# Patient Record
Sex: Female | Born: 1975 | Race: Black or African American | Hispanic: No | Marital: Single | Smoking: Never smoker
Health system: Southern US, Community
[De-identification: ages and names within clinical notes are randomized; demographics above are authoritative.]

## PROBLEM LIST (undated history)

## (undated) DIAGNOSIS — I491 Atrial premature depolarization: Secondary | ICD-10-CM

## (undated) DIAGNOSIS — J302 Other seasonal allergic rhinitis: Secondary | ICD-10-CM

## (undated) DIAGNOSIS — K219 Gastro-esophageal reflux disease without esophagitis: Secondary | ICD-10-CM

## (undated) DIAGNOSIS — R002 Palpitations: Secondary | ICD-10-CM

## (undated) DIAGNOSIS — I2510 Atherosclerotic heart disease of native coronary artery without angina pectoris: Secondary | ICD-10-CM

## (undated) HISTORY — DX: Gastro-esophageal reflux disease without esophagitis: K21.9

## (undated) HISTORY — DX: Palpitations: R00.2

## (undated) HISTORY — DX: Other seasonal allergic rhinitis: J30.2

## (undated) HISTORY — PX: NO PAST SURGERIES: SHX2092

## (undated) HISTORY — DX: Atrial premature depolarization: I49.1

## (undated) HISTORY — DX: Atherosclerotic heart disease of native coronary artery without angina pectoris: I25.10

---

## 2000-08-28 ENCOUNTER — Other Ambulatory Visit: Admission: RE | Admit: 2000-08-28 | Discharge: 2000-08-28 | Payer: Self-pay | Admitting: Family Medicine

## 2001-09-12 ENCOUNTER — Other Ambulatory Visit: Admission: RE | Admit: 2001-09-12 | Discharge: 2001-09-12 | Payer: Self-pay | Admitting: Family Medicine

## 2002-01-22 ENCOUNTER — Encounter: Payer: Self-pay | Admitting: Family Medicine

## 2002-01-22 ENCOUNTER — Encounter: Admission: RE | Admit: 2002-01-22 | Discharge: 2002-01-22 | Payer: Self-pay | Admitting: Family Medicine

## 2002-02-03 ENCOUNTER — Encounter: Admission: RE | Admit: 2002-02-03 | Discharge: 2002-03-27 | Payer: Self-pay | Admitting: Family Medicine

## 2002-12-04 ENCOUNTER — Emergency Department (HOSPITAL_COMMUNITY): Admission: EM | Admit: 2002-12-04 | Discharge: 2002-12-04 | Payer: Self-pay | Admitting: Emergency Medicine

## 2004-02-09 ENCOUNTER — Emergency Department (HOSPITAL_COMMUNITY): Admission: EM | Admit: 2004-02-09 | Discharge: 2004-02-10 | Payer: Self-pay | Admitting: Emergency Medicine

## 2004-10-20 ENCOUNTER — Ambulatory Visit (HOSPITAL_COMMUNITY): Admission: RE | Admit: 2004-10-20 | Discharge: 2004-10-20 | Payer: Self-pay | Admitting: Obstetrics & Gynecology

## 2005-01-16 ENCOUNTER — Emergency Department (HOSPITAL_COMMUNITY): Admission: EM | Admit: 2005-01-16 | Discharge: 2005-01-16 | Payer: Self-pay | Admitting: Family Medicine

## 2007-03-01 ENCOUNTER — Emergency Department (HOSPITAL_COMMUNITY): Admission: EM | Admit: 2007-03-01 | Discharge: 2007-03-01 | Payer: Self-pay | Admitting: Family Medicine

## 2007-04-18 ENCOUNTER — Other Ambulatory Visit: Admission: RE | Admit: 2007-04-18 | Discharge: 2007-04-18 | Payer: Self-pay | Admitting: Obstetrics and Gynecology

## 2007-08-29 ENCOUNTER — Emergency Department (HOSPITAL_COMMUNITY): Admission: EM | Admit: 2007-08-29 | Discharge: 2007-08-29 | Payer: Self-pay | Admitting: Family Medicine

## 2008-05-20 ENCOUNTER — Other Ambulatory Visit: Admission: RE | Admit: 2008-05-20 | Discharge: 2008-05-20 | Payer: Self-pay | Admitting: Obstetrics and Gynecology

## 2009-08-04 ENCOUNTER — Other Ambulatory Visit: Admission: RE | Admit: 2009-08-04 | Discharge: 2009-08-04 | Payer: Self-pay | Admitting: Obstetrics and Gynecology

## 2009-11-19 ENCOUNTER — Ambulatory Visit (HOSPITAL_COMMUNITY): Admission: RE | Admit: 2009-11-19 | Discharge: 2009-11-19 | Payer: Self-pay | Admitting: Gastroenterology

## 2010-04-26 ENCOUNTER — Other Ambulatory Visit (HOSPITAL_COMMUNITY): Payer: Self-pay | Admitting: Gastroenterology

## 2010-04-26 DIAGNOSIS — R110 Nausea: Secondary | ICD-10-CM

## 2010-04-28 ENCOUNTER — Ambulatory Visit (HOSPITAL_COMMUNITY)
Admission: RE | Admit: 2010-04-28 | Discharge: 2010-04-28 | Disposition: A | Discharge status: Home / Self Care | Payer: 59 | Source: Ambulatory Visit | Attending: Gastroenterology | Admitting: Gastroenterology

## 2010-04-28 DIAGNOSIS — R12 Heartburn: Secondary | ICD-10-CM | POA: Insufficient documentation

## 2010-04-28 DIAGNOSIS — D259 Leiomyoma of uterus, unspecified: Secondary | ICD-10-CM | POA: Insufficient documentation

## 2010-04-28 DIAGNOSIS — R630 Anorexia: Secondary | ICD-10-CM | POA: Insufficient documentation

## 2010-04-28 DIAGNOSIS — R1013 Epigastric pain: Secondary | ICD-10-CM | POA: Insufficient documentation

## 2010-04-28 DIAGNOSIS — R110 Nausea: Secondary | ICD-10-CM | POA: Insufficient documentation

## 2010-04-28 DIAGNOSIS — K5900 Constipation, unspecified: Secondary | ICD-10-CM | POA: Insufficient documentation

## 2010-04-28 DIAGNOSIS — K219 Gastro-esophageal reflux disease without esophagitis: Secondary | ICD-10-CM | POA: Insufficient documentation

## 2010-04-28 MED ORDER — IOHEXOL 300 MG/ML  SOLN
100.0000 mL | Freq: Once | INTRAMUSCULAR | Status: AC | PRN
  Administered 2010-04-28: 100 mL via INTRAVENOUS

## 2010-10-05 ENCOUNTER — Other Ambulatory Visit (HOSPITAL_COMMUNITY)
Admission: RE | Admit: 2010-10-05 | Discharge: 2010-10-05 | Disposition: A | Discharge status: Home / Self Care | Payer: Medicaid Other | Source: Ambulatory Visit | Attending: Obstetrics and Gynecology | Admitting: Obstetrics and Gynecology

## 2010-10-05 ENCOUNTER — Other Ambulatory Visit: Payer: Self-pay | Admitting: Adult Health

## 2010-10-05 DIAGNOSIS — Z01419 Encounter for gynecological examination (general) (routine) without abnormal findings: Secondary | ICD-10-CM | POA: Insufficient documentation

## 2010-10-05 DIAGNOSIS — Z113 Encounter for screening for infections with a predominantly sexual mode of transmission: Secondary | ICD-10-CM | POA: Insufficient documentation

## 2010-11-01 ENCOUNTER — Other Ambulatory Visit: Payer: Self-pay | Admitting: Family Medicine

## 2010-11-01 ENCOUNTER — Ambulatory Visit
Admission: RE | Admit: 2010-11-01 | Discharge: 2010-11-01 | Disposition: A | Discharge status: Home / Self Care | Payer: Medicaid Other | Source: Ambulatory Visit | Attending: Family Medicine | Admitting: Family Medicine

## 2010-11-01 DIAGNOSIS — R52 Pain, unspecified: Secondary | ICD-10-CM

## 2014-12-02 ENCOUNTER — Other Ambulatory Visit: Payer: Self-pay | Admitting: Gastroenterology

## 2014-12-02 DIAGNOSIS — R1013 Epigastric pain: Secondary | ICD-10-CM

## 2014-12-10 ENCOUNTER — Ambulatory Visit (HOSPITAL_COMMUNITY)
Admission: RE | Admit: 2014-12-10 | Discharge: 2014-12-10 | Disposition: A | Discharge status: Home / Self Care | Payer: BC Managed Care – PPO | Source: Ambulatory Visit | Attending: Gastroenterology | Admitting: Gastroenterology

## 2014-12-10 DIAGNOSIS — R110 Nausea: Secondary | ICD-10-CM | POA: Insufficient documentation

## 2014-12-10 DIAGNOSIS — R634 Abnormal weight loss: Secondary | ICD-10-CM | POA: Insufficient documentation

## 2014-12-10 DIAGNOSIS — R630 Anorexia: Secondary | ICD-10-CM | POA: Diagnosis not present

## 2014-12-10 DIAGNOSIS — R1013 Epigastric pain: Secondary | ICD-10-CM | POA: Diagnosis present

## 2014-12-10 MED ORDER — SINCALIDE 5 MCG IJ SOLR
INTRAMUSCULAR | Status: AC
  Administered 2014-12-10: 5 ug via INTRAVENOUS
  Filled 2014-12-10: qty 5

## 2014-12-10 MED ORDER — SINCALIDE 5 MCG IJ SOLR
0.0200 ug/kg | Freq: Once | INTRAMUSCULAR | Status: AC
  Administered 2014-12-10: 5 ug via INTRAVENOUS

## 2014-12-10 MED ORDER — TECHNETIUM TC 99M MEBROFENIN IV KIT
5.2000 | PACK | Freq: Once | INTRAVENOUS | Status: DC | PRN
  Administered 2014-12-10: 5 via INTRAVENOUS
  Filled 2014-12-10: qty 6

## 2014-12-10 MED ORDER — STERILE WATER FOR INJECTION IJ SOLN
INTRAMUSCULAR | Status: AC
  Administered 2014-12-10: 5 mL
  Filled 2014-12-10: qty 10

## 2015-03-03 ENCOUNTER — Encounter: Payer: Self-pay | Admitting: *Deleted

## 2015-03-03 ENCOUNTER — Encounter: Payer: Self-pay | Admitting: Neurology

## 2015-03-03 ENCOUNTER — Ambulatory Visit (INDEPENDENT_AMBULATORY_CARE_PROVIDER_SITE_OTHER): Payer: BC Managed Care – PPO | Admitting: Neurology

## 2015-03-03 VITALS — BP 139/82 | HR 78 | Ht 65.5 in | Wt 142.2 lb

## 2015-03-03 DIAGNOSIS — R253 Fasciculation: Secondary | ICD-10-CM | POA: Diagnosis not present

## 2015-03-03 DIAGNOSIS — G4700 Insomnia, unspecified: Secondary | ICD-10-CM

## 2015-03-03 DIAGNOSIS — F411 Generalized anxiety disorder: Secondary | ICD-10-CM

## 2015-03-03 DIAGNOSIS — Z820 Family history of epilepsy and other diseases of the nervous system: Secondary | ICD-10-CM | POA: Insufficient documentation

## 2015-03-03 MED ORDER — PROPRANOLOL HCL ER 80 MG PO CP24: 80.0000 mg | ORAL_CAPSULE | Freq: Every day | ORAL | Status: DC

## 2015-03-03 NOTE — Patient Instructions (Signed)
Remember to drink plenty of fluid, eat healthy meals and do not skip any meals. Try to eat protein with a every meal and eat a healthy snack such as fruit or nuts in between meals. Try to keep a regular sleep-wake schedule and try to exercise daily, particularly in the form of walking, 20-30 minutes a day, if you can.   As far as your medications are concerned, I would like to suggest: propranolol at night  As far as diagnostic testing: labs  I would like to see you back as needed, sooner if we need to. Please call us with any interim questions, concerns, problems, updates or refill requests.   Our phone number is [phone]. We also have an after hours call service for urgent matters and there is a physician on-call for urgent questions. For any emergencies you know to call 911 or go to the nearest emergency room

## 2015-03-03 NOTE — Progress Notes (Signed)
[facility]    Provider:  Dr Jaynee Eagles Referring Provider: Lucianne Lei, MD Primary Care Physician:  No primary care provider on file.  CC:  fasciculations  HPI:  Stephanie Mcfarland is a 39 y.o. female here as a referral from Dr. Criss Rosales fofr muscle fasciculations. She is worried about a family history of huntington's chorea in her maternal grandmother and maternal great grandfather. Mother is 70. Patient barely remembers her grandmother. Patient's mother said that grandmother would drag her leg, she would have diarrhea. Patient and mother have no history of symptoms. Mother has never had genetic testing. Mother has not been tested or exhibited any symptoms. Patient is having fasciculations, a quick twitch in the muscles. It is random. It is any time. She feels it in the arms and in the legs and in her face. The symptoms started months ago. They are getting worse. The twitching is happening more often. No triggers. Nothing makes it better or worse. Not taking caffeine or any other medications that can cause fasciculations. No inciting event. No weakness. No problems chewing or swallowing. She infrequently has cramps when she is writing but no other dystonic events. Legs can be tight. She denies memory loss. She is not sleeping. She can fall asleep but can't stay asleep. She has stress in her life. She is wide awake and can't go back to bed, lays there for hours, he mind is racing. Otherwise no other neurologic complaints or focal neurologic deficits. She does show me a video on her camera of her thigh exhibiting a fasciculation.  Reviewed notes, labs and imaging from outside physicians, which showed:  CT of the head 01/2004 showed No acute intracranial abnormalities including mass lesion or mass effect, hydrocephalus, extra-axial fluid collection, midline shift, hemorrhage, or acute infarction, large ischemic events (personally reviewed images)    Review of Systems: Patient complains  of symptoms per HPI as well as the following symptoms: palpitations, insomnia, anxiety. Pertinent negatives per HPI. All others negative.   Social History   Social History  . Marital Status: Single    Spouse Name: N/A  . Number of Children: 1  . Years of Education: 16   Occupational History  . Business management     Social History Main Topics  . Smoking status: Never Smoker   . Smokeless tobacco: Not on file  . Alcohol Use: 0.0 oz/week    0 Standard drinks or equivalent per week     Comment: Socially  . Drug Use: No  . Sexual Activity: Not on file   Other Topics Concern  . Not on file   Social History Narrative   Lives at home with son   CAffeine use- 2 times per month    Family History  Problem Relation Age of Onset  . Diabetes Father   . Heart disease Father   . Heart disease Brother   . Heart disease Brother   . Hyperlipidemia Mother   . Hypertension Father     Past Medical History  Diagnosis Date  . GERD (gastroesophageal reflux disease)   . Seasonal allergies     Past Surgical History  Procedure Laterality Date  . No past surgeries      Current Outpatient Prescriptions  Medication Sig Dispense Refill  . ASHLYNA 0.15-0.03 &0.01 MG tablet   0  . cetirizine (ZYRTEC) 10 MG tablet Take 10 mg by mouth daily as needed for allergies.    . DEXILANT 60 MG capsule Take 1 capsule by mouth daily.  1  . Linaclotide (LINZESS) 145 MCG CAPS capsule Take 145 mcg by mouth daily as needed.     . mometasone (NASONEX) 50 MCG/ACT nasal spray instill 2 sprays into each nostril once daily  0  . Multiple Vitamins-Minerals (MULTIVITAMIN PO) Take 1 tablet by mouth daily.     No current facility-administered medications for this visit.    Allergies as of 03/03/2015 - Review Complete 03/03/2015  Allergen Reaction Noted  . Sulfa antibiotics  03/03/2015    Vitals: BP 139/82 mmHg  Pulse 78  Ht 5' 5.5" (1.664 m)  Wt 142 lb 3.2 oz (64.501 kg)  BMI 23.29 kg/m2 Last  Weight:  Wt Readings from Last 1 Encounters:  03/03/15 142 lb 3.2 oz (64.501 kg)   Last Height:   Ht Readings from Last 1 Encounters:  03/03/15 5' 5.5" (1.664 m)   Physical exam: Exam: Gen: NAD, conversant, well nourised, well groomed                     CV: RRR, no MRG. No Carotid Bruits. No peripheral edema, warm, nontender Eyes: Conjunctivae clear without exudates or hemorrhage  Neuro: Detailed Neurologic Exam  Speech:    Speech is normal; fluent and spontaneous with normal comprehension.  Cognition:    The patient is oriented to person, place, and time;     recent and remote memory intact;     language fluent;     normal attention, concentration,     fund of knowledge Cranial Nerves:    The pupils are equal, round, and reactive to light. The fundi are normal and spontaneous venous pulsations are present. Visual fields are full to finger confrontation. Extraocular movements are intact. Trigeminal sensation is intact and the muscles of mastication are normal. The face is symmetric. The palate elevates in the midline. Hearing intact. Voice is normal. Shoulder shrug is normal. The tongue has normal motion without fasciculations.   Coordination:    Normal finger to nose and heel to shin. Normal rapid alternating movements.   Gait:    Heel-toe and tandem gait are normal.   Motor Observation:    No asymmetry, no atrophy, and no involuntary movements noted. Tone:    Normal muscle tone.    Posture:    Posture is normal. normal erect    Strength:    Strength is V/V in the upper and lower limbs.      Sensation: intact to LT     Reflex Exam:  DTR's:    Deep tendon reflexes in the upper and lower extremities are normal bilaterally.   Toes:    The toes are downgoing bilaterally.   Clonus:    Clonus is absent.       Assessment/Plan:  39 year old patient with fasciculations. I did see a video of one of her thigh fasciculations but none on exam today. He neurologic exam  is completely normal. Reassured her that this is not the beginning of Huntington's chorea. Will check labs. Can consider emg/ncs and brain mri in the future if symptoms persist or worsen. Discussed genetic testing. Discussed trying to alleviate stress and foods/medications to avoid.   Cc: veita bland, MD  Sarina Ill, MD  [facility] [address] [place], [place] [address]  Phone [phone] Fax [phone]

## 2015-03-04 ENCOUNTER — Telehealth: Payer: Self-pay | Admitting: *Deleted

## 2015-03-04 LAB — BASIC METABOLIC PANEL
BUN/Creatinine Ratio: 10 (ref 8–20)
BUN: 7 mg/dL (ref 6–20)
CO2: 23 mmol/L (ref 18–29)
Calcium: 9.4 mg/dL (ref 8.7–10.2)
Chloride: 103 mmol/L (ref 96–106)
Creatinine, Ser: 0.73 mg/dL (ref 0.57–1.00)
GFR calc Af Amer: 120 mL/min/{1.73_m2} (ref >59)
GFR calc non Af Amer: 104 mL/min/{1.73_m2} (ref >59)
Glucose: 86 mg/dL (ref 65–99)
Potassium: 4.3 mmol/L (ref 3.5–5.2)
Sodium: 141 mmol/L (ref 134–144)

## 2015-03-04 LAB — THYROID PANEL WITH TSH
Free Thyroxine Index: 2.5 (ref 1.2–4.9)
T3 Uptake Ratio: 27 % (ref 24–39)
T4, Total: 9.1 ug/dL (ref 4.5–12.0)
TSH: 3.02 u[IU]/mL (ref 0.450–4.500)

## 2015-03-04 LAB — MAGNESIUM: Magnesium: 2.3 mg/dL (ref 1.6–2.3)

## 2015-03-04 NOTE — Telephone Encounter (Signed)
-----   Message from Melvenia Beam, MD sent at 03/04/2015  7:31 AM EST ----- All labs normal thanks

## 2015-03-04 NOTE — Telephone Encounter (Signed)
Called pt about normal labs per Dr Jaynee Eagles. Pt verbalized understanding.

## 2015-03-20 ENCOUNTER — Emergency Department (HOSPITAL_COMMUNITY): Payer: BC Managed Care – PPO

## 2015-03-20 ENCOUNTER — Emergency Department (HOSPITAL_COMMUNITY)
Admission: EM | Admit: 2015-03-20 | Discharge: 2015-03-20 | Disposition: A | Discharge status: Home / Self Care | Payer: BC Managed Care – PPO | Attending: Emergency Medicine | Admitting: Emergency Medicine

## 2015-03-20 DIAGNOSIS — Z793 Long term (current) use of hormonal contraceptives: Secondary | ICD-10-CM | POA: Insufficient documentation

## 2015-03-20 DIAGNOSIS — Z79899 Other long term (current) drug therapy: Secondary | ICD-10-CM | POA: Insufficient documentation

## 2015-03-20 DIAGNOSIS — K219 Gastro-esophageal reflux disease without esophagitis: Secondary | ICD-10-CM | POA: Insufficient documentation

## 2015-03-20 DIAGNOSIS — R002 Palpitations: Secondary | ICD-10-CM | POA: Insufficient documentation

## 2015-03-20 DIAGNOSIS — Z7982 Long term (current) use of aspirin: Secondary | ICD-10-CM | POA: Diagnosis not present

## 2015-03-20 DIAGNOSIS — R079 Chest pain, unspecified: Secondary | ICD-10-CM

## 2015-03-20 DIAGNOSIS — Z3202 Encounter for pregnancy test, result negative: Secondary | ICD-10-CM | POA: Diagnosis not present

## 2015-03-20 LAB — CBC
HCT: 37.2 % (ref 36.0–46.0)
Hemoglobin: 12.3 g/dL (ref 12.0–15.0)
MCH: 27.6 pg (ref 26.0–34.0)
MCHC: 33.1 g/dL (ref 30.0–36.0)
MCV: 83.4 fL (ref 78.0–100.0)
Platelets: 232 10*3/uL (ref 150–400)
RBC: 4.46 MIL/uL (ref 3.87–5.11)
RDW: 13 % (ref 11.5–15.5)
WBC: 4.9 10*3/uL (ref 4.0–10.5)

## 2015-03-20 LAB — BASIC METABOLIC PANEL
Anion gap: 8 (ref 5–15)
BUN: 7 mg/dL (ref 6–20)
CO2: 25 mmol/L (ref 22–32)
Calcium: 9.1 mg/dL (ref 8.9–10.3)
Chloride: 104 mmol/L (ref 101–111)
Creatinine, Ser: 0.82 mg/dL (ref 0.44–1.00)
GFR calc Af Amer: >60 mL/min (ref >60)
GFR calc non Af Amer: >60 mL/min (ref >60)
Glucose, Bld: 109 mg/dL — ABNORMAL HIGH (ref 65–99)
Potassium: 3.7 mmol/L (ref 3.5–5.1)
Sodium: 137 mmol/L (ref 135–145)

## 2015-03-20 LAB — I-STAT BETA HCG BLOOD, ED (MC, WL, AP ONLY): I-stat hCG, quantitative: <5 m[IU]/mL (ref <5)

## 2015-03-20 LAB — D-DIMER, QUANTITATIVE: D-Dimer, Quant: 1.41 ug/mL-FEU — ABNORMAL HIGH (ref 0.00–0.50)

## 2015-03-20 LAB — I-STAT TROPONIN, ED: Troponin i, poc: 0 ng/mL (ref 0.00–0.08)

## 2015-03-20 MED ORDER — IOHEXOL 350 MG/ML SOLN
100.0000 mL | Freq: Once | INTRAVENOUS | Status: AC | PRN
  Administered 2015-03-20: 100 mL via INTRAVENOUS

## 2015-03-20 MED ORDER — NAPROXEN 500 MG PO TABS: 500.0000 mg | ORAL_TABLET | Freq: Two times a day (BID) | ORAL | Status: DC

## 2015-03-20 NOTE — ED Notes (Signed)
Pt. On monitor. 

## 2015-03-20 NOTE — ED Provider Notes (Signed)
CSN: [id number]     Arrival date & time 03/20/15  [id number] History   First MD Initiated Contact with Patient 03/20/15 0701     Chief Complaint  Patient presents with  . Chest Pain   HPI Comments: Sx started last evening around 11p.  She has had sharp chest discomfort under her left breast that comes and goes and a constant irregular heart beat sensation.  Patient is a 39 y.o. female presenting with chest pain. The history is provided by the patient.  Chest Pain Pain location:  L chest Pain quality: sharp   Pain radiates to:  Does not radiate Pain severity:  Moderate Chronicity:  New Relieved by:  Nothing Exacerbated by: nothing. Associated symptoms: palpitations   Associated symptoms: no abdominal pain, no anxiety, no back pain, no cough, no diaphoresis, no dysphagia, no fatigue, no fever, no nausea, no numbness, no orthopnea, no shortness of breath, not vomiting and no weakness   Risk factors: birth control   Risk factors: no aortic disease, no coronary artery disease, no diabetes mellitus and no prior DVT/PE   Risk factors comment:  Family history of heart disease in brother and Father Pt does not have chest pain with exertion or exercise.  Past Medical History  Diagnosis Date  . GERD (gastroesophageal reflux disease)   . Seasonal allergies    Past Surgical History  Procedure Laterality Date  . No past surgeries     Family History  Problem Relation Age of Onset  . Diabetes Father   . Heart disease Father   . Heart disease Brother   . Heart disease Brother   . Hyperlipidemia Mother   . Hypertension Father    Social History  Substance Use Topics  . Smoking status: Never Smoker   . Smokeless tobacco: Not on file  . Alcohol Use: 0.0 oz/week    0 Standard drinks or equivalent per week     Comment: Socially   OB History    No data available     Review of Systems  Constitutional: Negative for fever, diaphoresis and fatigue.  HENT: Negative for trouble swallowing.    Respiratory: Negative for cough and shortness of breath.   Cardiovascular: Positive for chest pain and palpitations. Negative for orthopnea.  Gastrointestinal: Negative for nausea, vomiting and abdominal pain.  Musculoskeletal: Negative for back pain.  Neurological: Negative for weakness and numbness.  All other systems reviewed and are negative.     Allergies  Sulfa antibiotics  Home Medications   Prior to Admission medications   Medication Sig Start Date End Date Taking? Authorizing Provider  Challis 0.15-0.03 &0.01 MG tablet  01/11/15  Yes Historical Provider, MD  aspirin EC 81 MG tablet Take 81 mg by mouth once.   Yes Historical Provider, MD  cetirizine (ZYRTEC) 10 MG tablet Take 10 mg by mouth daily as needed for allergies.   Yes Historical Provider, MD  DEXILANT 60 MG capsule Take 60 mg by mouth daily.  01/29/15  Yes Historical Provider, MD  Linaclotide Rolan Lipa) 145 MCG CAPS capsule Take 145 mcg by mouth daily as needed (for constipation).    Yes Historical Provider, MD  mometasone (NASONEX) 50 MCG/ACT nasal spray instill 2 sprays into each nostril once daily as needed for nasal congestion 12/30/14  Yes Historical Provider, MD  Multiple Vitamins-Minerals (MULTIVITAMIN PO) Take 1 tablet by mouth daily.   Yes Historical Provider, MD  propranolol ER (INDERAL LA) 80 MG 24 hr capsule Take 1 capsule (80 mg total) by  mouth daily. 03/03/15  Yes Melvenia Beam, MD  naproxen (NAPROSYN) 500 MG tablet Take 1 tablet (500 mg total) by mouth 2 (two) times daily. 03/20/15   Dorie Rank, MD   BP 126/86 mmHg  Pulse 72  Temp(Src) 98.2 F (36.8 C) (Oral)  Resp 19  Ht 5\' 5"  (1.651 m)  Wt 63.504 kg  BMI 23.30 kg/m2  SpO2 100%  LMP 01/12/2015 Physical Exam  Constitutional: She appears well-developed and well-nourished. No distress.  HENT:  Head: Normocephalic and atraumatic.  Right Ear: External ear normal.  Left Ear: External ear normal.  Eyes: Conjunctivae are normal. Right eye exhibits  no discharge. Left eye exhibits no discharge. No scleral icterus.  Neck: Neck supple. No tracheal deviation present.  Cardiovascular: Normal rate, regular rhythm and intact distal pulses.   Pulmonary/Chest: Effort normal and breath sounds normal. No stridor. No respiratory distress. She has no wheezes. She has no rales.  Abdominal: Soft. Bowel sounds are normal. She exhibits no distension. There is no tenderness. There is no rebound and no guarding.  Musculoskeletal: She exhibits no edema or tenderness.  Neurological: She is alert. She has normal strength. No cranial nerve deficit (no facial droop, extraocular movements intact, no slurred speech) or sensory deficit. She exhibits normal muscle tone. She displays no seizure activity. Coordination normal.  Skin: Skin is warm and dry. No rash noted.  Psychiatric: She has a normal mood and affect.  Nursing note and vitals reviewed.   ED Course  Procedures (including critical care time) Labs Review Labs Reviewed  BASIC METABOLIC PANEL - Abnormal; Notable for the following:    Glucose, Bld 109 (*)    All other components within normal limits  D-DIMER, QUANTITATIVE (NOT AT [facility]) - Abnormal; Notable for the following:    D-Dimer, Quant 1.41 (*)    All other components within normal limits  CBC  I-STAT TROPOININ, ED  I-STAT BETA HCG BLOOD, ED ([facility], [facility], [facility] ONLY)    Imaging Review Dg Chest 2 View  03/20/2015  CLINICAL DATA:  Chest pain. EXAM: CHEST  2 VIEW COMPARISON:  None. FINDINGS: The heart size and mediastinal contours are within normal limits. Both lungs are clear. The visualized skeletal structures are unremarkable. IMPRESSION: Normal exam. Electronically Signed   By: Lorriane Shire M.D.   On: 03/20/2015 07:22   Ct Angio Chest Pe W/cm &/or Wo Cm  03/20/2015  CLINICAL DATA:  Sharp chest pain behind left breast. Irregular heart rate. Symptoms since last night. EXAM: CT ANGIOGRAPHY CHEST WITH CONTRAST TECHNIQUE: Multidetector CT imaging of  the chest was performed using the standard protocol during bolus administration of intravenous contrast. Multiplanar CT image reconstructions and MIPs were obtained to evaluate the vascular anatomy. CONTRAST:  124mL OMNIPAQUE IOHEXOL 350 MG/ML SOLN COMPARISON:  None. FINDINGS: There are no filling defects in the pulmonary arterial tree to suggest acute pulmonary thromboembolism. There is no evidence of abnormal mediastinal adenopathy or pericardial effusion. Normal thyroid. No pneumothorax or pleural effusion. No acute bony deformity. Review of the MIP images confirms the above findings. IMPRESSION: No evidence of acute pulmonary thromboembolism. Electronically Signed   By: Marybelle Killings M.D.   On: 03/20/2015 09:42   I have personally reviewed and evaluated these images and lab results as part of my medical decision-making.   EKG Interpretation   Date/Time:  Saturday March 20 2015 06:04:10 EST Ventricular Rate:  72 PR Interval:  161 QRS Duration: 78 QT Interval:  371 QTC Calculation: 406 R Axis:  82 Text Interpretation:  Sinus rhythm Confirmed by [facility]  MD, APRIL  ([id number]) on 03/20/2015 6:10:28 AM      MDM   Final diagnoses:  Chest pain, unspecified chest pain type    Patient's symptoms have been ongoing for approximately 11 hours. ECG and cardiac enzymes are reassuring.  D dimer is elevated but CT angio is negative.  Doubt ACS or other serious etiology for her pain.  Follow up with PCP in 1 week.  Rx nsaids   Dorie Rank, MD 03/20/15 1021

## 2015-03-20 NOTE — ED Notes (Signed)
Pt c/o sharp pain behind her left breast and irregular heart beat that started around 2300 last night. Pt is alert and oriented x 4, VS stable, no respiratory distress.

## 2015-03-20 NOTE — Discharge Instructions (Signed)

## 2016-04-02 ENCOUNTER — Other Ambulatory Visit: Payer: Self-pay | Admitting: Neurology

## 2016-04-02 DIAGNOSIS — R253 Fasciculation: Secondary | ICD-10-CM

## 2017-05-11 ENCOUNTER — Other Ambulatory Visit: Payer: Self-pay | Admitting: Obstetrics and Gynecology

## 2017-05-11 DIAGNOSIS — R928 Other abnormal and inconclusive findings on diagnostic imaging of breast: Secondary | ICD-10-CM

## 2017-05-16 ENCOUNTER — Ambulatory Visit
Admission: RE | Admit: 2017-05-16 | Discharge: 2017-05-16 | Disposition: A | Discharge status: Home / Self Care | Payer: BC Managed Care – PPO | Source: Ambulatory Visit | Attending: Obstetrics and Gynecology | Admitting: Obstetrics and Gynecology

## 2017-05-16 DIAGNOSIS — R928 Other abnormal and inconclusive findings on diagnostic imaging of breast: Secondary | ICD-10-CM

## 2017-05-25 ENCOUNTER — Telehealth: Payer: Self-pay | Admitting: Cardiovascular Disease

## 2017-05-25 NOTE — Telephone Encounter (Signed)
Records received from Dr. Adrian Prows on 05/25/17, Appt 07/19/17 at 9:40am [email or web address]

## 2017-06-13 ENCOUNTER — Telehealth: Payer: Self-pay | Admitting: Cardiovascular Disease

## 2017-06-13 NOTE — Telephone Encounter (Signed)
Received incoming records from [facility] for upcoming appointment on 07/19/17 @ 9:40am with Dr. Oval Linsey. Records given to [facility] in Medical Records. 06/13/17 ab

## 2017-07-19 ENCOUNTER — Encounter

## 2017-07-19 ENCOUNTER — Ambulatory Visit: Payer: BC Managed Care – PPO | Admitting: Cardiovascular Disease

## 2017-07-19 ENCOUNTER — Encounter: Payer: Self-pay | Admitting: Cardiovascular Disease

## 2017-07-19 VITALS — BP 119/78 | HR 81 | Ht 65.5 in | Wt 153.4 lb

## 2017-07-19 DIAGNOSIS — I493 Ventricular premature depolarization: Secondary | ICD-10-CM

## 2017-07-19 DIAGNOSIS — I491 Atrial premature depolarization: Secondary | ICD-10-CM | POA: Diagnosis not present

## 2017-07-19 DIAGNOSIS — R002 Palpitations: Secondary | ICD-10-CM | POA: Diagnosis not present

## 2017-07-19 DIAGNOSIS — Z8249 Family history of ischemic heart disease and other diseases of the circulatory system: Secondary | ICD-10-CM | POA: Diagnosis not present

## 2017-07-19 HISTORY — DX: Palpitations: R00.2

## 2017-07-19 HISTORY — DX: Atrial premature depolarization: I49.1

## 2017-07-19 MED ORDER — METOPROLOL SUCCINATE ER 25 MG PO TB24: 25.0000 mg | ORAL_TABLET | Freq: Every day | ORAL | 1 refills | Status: DC

## 2017-07-19 NOTE — Patient Instructions (Addendum)
Medication Instructions:  START METOPROLOL SUC 25 MG DAILY   Labwork: NONE  Testing/Procedures: CALCIUM SCORE, WILL BE $150 OUT OF POCKET [facility] [place] [address]   Follow-Up: Your physician recommends that you schedule a follow-up appointment in: 2-3 MONTHS   If you need a refill on your cardiac medications before your next appointment, please call your pharmacy.

## 2017-07-19 NOTE — Progress Notes (Signed)
Cardiology Office Note   Date:  07/19/2017   ID:  Stephanie Mcfarland, DOB [birth year], MRN [id number]  PCP:  Lucianne Lei, MD  Cardiologist:   Skeet Latch, MD   Chief Complaint  Patient presents with  . New Patient (Initial Visit)     History of Present Illness: Stephanie Mcfarland is a 42 y.o. female with family history of premature CAD and strokes, allergies, and GERDwho is being seen today for the evaluation of  at the request of Dr. Servando Salina.  Stephanie Mcfarland saw Dr. Garwin Brothers for a routine exam.  At that time she was noted to have ectopy.  She reported that she had previously seen Dr. Einar Gip for palpitations.  In 2015 she wore a Holter monitor that showed occasional PVCs and was otherwise unremarkable.  She continues to have palpitations daily.  It occurs on and off throughout the day.  There is no associated shortness of breath, chest pain, lightheadedness, or dizziness.   She does not have much caffeine in her diet.  She does not use over-the-counter cold or cough medications regularly with the exception of Zyrtec.  She cut back on exercising due to concern that she may have issues with her heart.  She has not had any lower extremity edema, orthopnea, or PND.  In the past she had chest pain at rest but never with exertion.  She was previously on propranolol for benign fasciculations.  It did help with the fasciculations which she is unsure if this helps her palpitations.  Of note, she has a strong family history of cardiovascular disease.  Her father had his first MI in his 62s and had a stroke in his 60s.  Her brother had a stroke at age 39 and another brother had premature CAD requiring CABG.     Past Medical History:  Diagnosis Date  . GERD (gastroesophageal reflux disease)   . PAC (premature atrial contraction) 07/19/2017  . Palpitations 07/19/2017  . Seasonal allergies     Past Surgical History:  Procedure Laterality Date  . NO PAST SURGERIES       Current Outpatient  Medications  Medication Sig Dispense Refill  . cetirizine (ZYRTEC) 10 MG tablet Take 10 mg by mouth daily as needed for allergies.    . DEXILANT 60 MG capsule Take 60 mg by mouth daily.   1  . methylPREDNISolone Acetate (DEPO-MEDROL IJ) Inject as directed.    . mometasone (NASONEX) 50 MCG/ACT nasal spray instill 2 sprays into each nostril once daily as needed for nasal congestion  0  . Multiple Vitamins-Minerals (MULTIVITAMIN PO) Take 1 tablet by mouth daily.    . metoprolol succinate (TOPROL XL) 25 MG 24 hr tablet Take 1 tablet (25 mg total) by mouth daily. 90 tablet 1   No current facility-administered medications for this visit.     Allergies:   Sulfa antibiotics    Social History:  The patient  reports that she has never smoked. She has never used smokeless tobacco. She reports that she drinks alcohol. She reports that she does not use drugs.   Family History:  The patient's family history includes Diabetes in her father and paternal grandfather; Heart attack in her brother and father; Heart disease in her brother, brother, and father; Hyperlipidemia in her mother; Hypertension in her brother and father; Stroke in her brother and father.    ROS:  Please see the history of present illness.   Otherwise, review of systems are positive for none.  All other systems are reviewed and negative.    PHYSICAL EXAM: VS:  BP 119/78   Pulse 81   Ht 5' 5.5" (1.664 m)   Wt 153 lb 6.4 oz (69.6 kg)   BMI 25.14 kg/m  , BMI Body mass index is 25.14 kg/m. GENERAL:  Well appearing.  No acute distress HEENT:  Pupils equal round and reactive, fundi not visualized, oral mucosa unremarkable NECK:  No jugular venous distention, waveform within normal limits, carotid upstroke brisk and symmetric, no bruits, no thyromegaly LYMPHATICS:  No cervical adenopathy LUNGS:  Clear to auscultation bilaterally HEART:  RRR.  PMI not displaced or sustained,S1 and S2 within normal limits, no S3, no S4, no clicks, no  rubs, no murmurs ABD:  Flat, positive bowel sounds normal in frequency in pitch, no bruits, no rebound, no guarding, no midline pulsatile mass, no hepatomegaly, no splenomegaly EXT:  2 plus pulses throughout, no edema, no cyanosis no clubbing SKIN:  No rashes no nodules NEURO:  Cranial nerves II through XII grossly intact, motor grossly intact throughout PSYCH:  Cognitively intact, oriented to person place and time   EKG:  EKG is ordered today. The ekg ordered today demonstrates Sinus rhythm.  Rate 81 bpm.  48-hour Holter 06/2013:  16 PVCs noted.  2 PACs.  Recent Labs: No results found for requested labs within last 8760 hours.   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  She Artie had labs checked with Dr. cousins and they were within normal limits.  We will give a trial of metoprolol succinate 25 mg daily.  # Family history of premature CVD:  Check coronary calcium score.  ASCVD risk alone isn't high enough for aspirin or statin.   Current medicines are reviewed at length with the patient today.  The patient does not have concerns regarding medicines.  The following changes have been made:  Metoprolol succinate 25mg  daily.  Labs/ tests ordered today include:   Orders Placed This Encounter  Procedures  . CT CARDIAC SCORING  . EKG 12-Lead     Disposition:   FU with Kenzie Flakes C. Oval Linsey, MD, [facility] in 2-3 months.      Signed, Tiye Huwe C. Oval Linsey, MD, [facility]  07/19/2017 10:51 AM    [place]

## 2017-08-21 ENCOUNTER — Ambulatory Visit (INDEPENDENT_AMBULATORY_CARE_PROVIDER_SITE_OTHER)
Admission: RE | Admit: 2017-08-21 | Discharge: 2017-08-21 | Disposition: A | Discharge status: Home / Self Care | Payer: BC Managed Care – PPO | Source: Ambulatory Visit | Attending: Cardiovascular Disease | Admitting: Cardiovascular Disease

## 2017-08-21 DIAGNOSIS — R002 Palpitations: Secondary | ICD-10-CM

## 2017-08-23 ENCOUNTER — Telehealth: Payer: Self-pay | Admitting: *Deleted

## 2017-08-23 DIAGNOSIS — Z823 Family history of stroke: Secondary | ICD-10-CM

## 2017-08-23 DIAGNOSIS — R002 Palpitations: Secondary | ICD-10-CM

## 2017-08-23 DIAGNOSIS — Z8249 Family history of ischemic heart disease and other diseases of the circulatory system: Secondary | ICD-10-CM

## 2017-08-23 NOTE — Telephone Encounter (Signed)
-----   Message from Skeet Latch, MD sent at 08/23/2017  7:54 AM EDT ----- Coronary calcium score is very high. 99th percentile for age and gender.  Recommend getting a coronary CT-A to better evaluate for how severe the blockages are.

## 2017-08-23 NOTE — Telephone Encounter (Signed)
Left message to call back  

## 2017-08-24 NOTE — Telephone Encounter (Signed)
Follow up: ° ° °Patient returning call  ° ° ° °

## 2017-08-24 NOTE — Telephone Encounter (Signed)
Spoke to patient .  Result given  Patient aware will schedule CCTA. Patient aware  Rip Harbour will call back with  Instruction for CCTA. Patient aware test will need to be pre-cert then time and date will be given to her. Follow appointment is not until 10/22/17

## 2017-08-29 ENCOUNTER — Encounter: Payer: Self-pay | Admitting: *Deleted

## 2017-08-29 NOTE — Telephone Encounter (Signed)
Notes recorded by Earvin Hansen, LPN on 10/27/1749 at 02:58 AM EDT Patient aware, letter mailed to patient, order for bmet placed in Epic, and sent to precert/PCC for scheduling

## 2017-09-12 ENCOUNTER — Other Ambulatory Visit: Payer: Self-pay | Admitting: Obstetrics and Gynecology

## 2017-09-12 DIAGNOSIS — N6320 Unspecified lump in the left breast, unspecified quadrant: Secondary | ICD-10-CM

## 2017-09-13 ENCOUNTER — Ambulatory Visit
Admission: RE | Admit: 2017-09-13 | Discharge: 2017-09-13 | Disposition: A | Discharge status: Home / Self Care | Payer: BC Managed Care – PPO | Source: Ambulatory Visit | Attending: Obstetrics and Gynecology | Admitting: Obstetrics and Gynecology

## 2017-09-13 DIAGNOSIS — N6320 Unspecified lump in the left breast, unspecified quadrant: Secondary | ICD-10-CM

## 2017-10-03 LAB — BASIC METABOLIC PANEL
BUN/Creatinine Ratio: 14 (ref 9–23)
BUN: 11 mg/dL (ref 6–24)
CO2: 19 mmol/L — ABNORMAL LOW (ref 20–29)
Calcium: 9.5 mg/dL (ref 8.7–10.2)
Chloride: 107 mmol/L — ABNORMAL HIGH (ref 96–106)
Creatinine, Ser: 0.76 mg/dL (ref 0.57–1.00)
GFR calc Af Amer: 113 mL/min/{1.73_m2} (ref >59)
GFR calc non Af Amer: 98 mL/min/{1.73_m2} (ref >59)
Glucose: 74 mg/dL (ref 65–99)
Potassium: 4.3 mmol/L (ref 3.5–5.2)
Sodium: 144 mmol/L (ref 134–144)

## 2017-10-09 ENCOUNTER — Ambulatory Visit (HOSPITAL_COMMUNITY)
Admission: RE | Admit: 2017-10-09 | Discharge: 2017-10-09 | Disposition: A | Discharge status: Home / Self Care | Payer: BC Managed Care – PPO | Source: Ambulatory Visit | Attending: Cardiovascular Disease | Admitting: Cardiovascular Disease

## 2017-10-09 ENCOUNTER — Encounter (HOSPITAL_COMMUNITY): Payer: Self-pay

## 2017-10-09 DIAGNOSIS — R079 Chest pain, unspecified: Secondary | ICD-10-CM | POA: Diagnosis not present

## 2017-10-09 DIAGNOSIS — Z8249 Family history of ischemic heart disease and other diseases of the circulatory system: Secondary | ICD-10-CM | POA: Diagnosis not present

## 2017-10-09 DIAGNOSIS — R002 Palpitations: Secondary | ICD-10-CM

## 2017-10-09 DIAGNOSIS — I2510 Atherosclerotic heart disease of native coronary artery without angina pectoris: Secondary | ICD-10-CM | POA: Insufficient documentation

## 2017-10-09 DIAGNOSIS — Z823 Family history of stroke: Secondary | ICD-10-CM | POA: Diagnosis not present

## 2017-10-09 MED ORDER — NITROGLYCERIN 0.4 MG SL SUBL
SUBLINGUAL_TABLET | SUBLINGUAL | Status: AC
  Administered 2017-10-09: 0.8 mg via SUBLINGUAL
  Filled 2017-10-09: qty 2

## 2017-10-09 MED ORDER — NITROGLYCERIN 0.4 MG SL SUBL
0.8000 mg | SUBLINGUAL_TABLET | Freq: Once | SUBLINGUAL | Status: AC
  Administered 2017-10-09: 0.8 mg via SUBLINGUAL
  Filled 2017-10-09: qty 25

## 2017-10-09 MED ORDER — IOPAMIDOL (ISOVUE-370) INJECTION 76%
100.0000 mL | Freq: Once | INTRAVENOUS | Status: AC | PRN
  Administered 2017-10-09: 80 mL via INTRAVENOUS

## 2017-10-09 MED ORDER — IOPAMIDOL (ISOVUE-370) INJECTION 76%
INTRAVENOUS | Status: AC
  Filled 2017-10-09: qty 100

## 2017-10-09 MED ORDER — METOPROLOL TARTRATE 5 MG/5ML IV SOLN
5.0000 mg | INTRAVENOUS | Status: DC | PRN
  Administered 2017-10-09 (×3): 5 mg via INTRAVENOUS
  Filled 2017-10-09 (×3): qty 5

## 2017-10-09 MED ORDER — METOPROLOL TARTRATE 5 MG/5ML IV SOLN
INTRAVENOUS | Status: AC
  Administered 2017-10-09: 5 mg via INTRAVENOUS
  Filled 2017-10-09: qty 20

## 2017-10-11 ENCOUNTER — Telehealth: Payer: Self-pay | Admitting: *Deleted

## 2017-10-11 DIAGNOSIS — Z5181 Encounter for therapeutic drug level monitoring: Secondary | ICD-10-CM

## 2017-10-11 DIAGNOSIS — E785 Hyperlipidemia, unspecified: Secondary | ICD-10-CM

## 2017-10-11 DIAGNOSIS — Z8249 Family history of ischemic heart disease and other diseases of the circulatory system: Secondary | ICD-10-CM

## 2017-10-11 MED ORDER — ROSUVASTATIN CALCIUM 20 MG PO TABS: 20.0000 mg | ORAL_TABLET | Freq: Every day | ORAL | 3 refills | Status: DC

## 2017-10-11 NOTE — Telephone Encounter (Signed)
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  It is mild but more than expected for her age.  Therefore start aspirin and rosuvastatin 20 mg daily.  Check lipids and CMP in 2 months.

## 2017-10-11 NOTE — Telephone Encounter (Signed)
Left message to call back  

## 2017-10-11 NOTE — Telephone Encounter (Signed)
Advised patient of results, new medications, and need for follow up labs Mailed lab orders

## 2017-10-11 NOTE — Telephone Encounter (Signed)
-----   Message from Skeet Latch, MD sent at 10/08/2017  8:26 AM EDT ----- Normal kidney function.  No changes.

## 2017-10-19 ENCOUNTER — Ambulatory Visit: Payer: BC Managed Care – PPO | Admitting: Family Medicine

## 2017-10-19 ENCOUNTER — Encounter: Payer: Self-pay | Admitting: Family Medicine

## 2017-10-19 VITALS — BP 120/62 | HR 74 | Temp 98.5°F | Ht 65.0 in | Wt 152.1 lb

## 2017-10-19 DIAGNOSIS — Z0184 Encounter for antibody response examination: Secondary | ICD-10-CM

## 2017-10-19 DIAGNOSIS — Z111 Encounter for screening for respiratory tuberculosis: Secondary | ICD-10-CM

## 2017-10-19 DIAGNOSIS — K219 Gastro-esophageal reflux disease without esophagitis: Secondary | ICD-10-CM

## 2017-10-19 DIAGNOSIS — Z23 Encounter for immunization: Secondary | ICD-10-CM | POA: Diagnosis not present

## 2017-10-19 DIAGNOSIS — R0683 Snoring: Secondary | ICD-10-CM | POA: Diagnosis not present

## 2017-10-19 NOTE — Progress Notes (Signed)
Tuberculin skin test applied to right ventral forearm. Explained how to read the test, measuring induration not just erythema; she will come into office if test appears positive.

## 2017-10-19 NOTE — Progress Notes (Signed)
Stephanie Mcfarland DOB: [birth year] Encounter date: 10/19/2017  This isa 42 y.o. female who presents to establish care. Chief Complaint  Patient presents with  . Establish Care    needs MMR, TB test    History of present illness:  She is planning to try therapeutic massage class and needs to get MMR, TB testing. She is doing this as a career change/trying something new. No other medical concerns today.   Hx of chicken pox as a child.   Has palpitations daily. Notes some with sitting still. Also with exercise/activity but doesn't stop her. Sees cardiology for this. Lasts for seconds.  Doesn't take dexilant daily. Feels that reflux is stress related. When she is able to control stress then sx improve. Saw GI for this in the past. Had EGD in 2011 which was normal (confirmed in med records by provider). Doesn't follow with GI now. Getting refills from PCP.   Gets depo for birth control. Does well with these. Has been on this for just over a year, but was on previously.   Runs 2 miles 3-4 times weekly. Sometimes goes to gym.   Just started on crestor last Friday. This was started due to CTA/calcium scoring from cardiology. High risk family history and testing results prompted this.   IMPRESSION from cardiac CTA: 1. Extensive plaque for age and gender, but there appears to be no more than mild stenosis.  2. Coronary artery calcium score 469 Agatston units, placing the patient in the 99th percentile for age and gender. This suggests high risk for future cardiac events  Sees Dr. Garwin Brothers for obgyn.  Sees dentist regularly (Dr. Marian Sorrow) [facility]ecare: [facility]  Past Medical History:  Diagnosis Date  . GERD (gastroesophageal reflux disease)   . PAC (premature atrial contraction) 07/19/2017  . Palpitations 07/19/2017  . Seasonal allergies    Past Surgical History:  Procedure Laterality Date  . NO PAST SURGERIES     Allergies  Allergen Reactions  . Sulfa Antibiotics Itching  and Rash    Rash, itching, bumps   Current Meds  Medication Sig  . aspirin EC 81 MG tablet Take 81 mg by mouth daily.  . cetirizine (ZYRTEC) 10 MG tablet Take 10 mg by mouth daily as needed for allergies.  . DEXILANT 60 MG capsule Take 60 mg by mouth daily.   . methylPREDNISolone Acetate (DEPO-MEDROL IJ) Inject as directed.  . metoprolol succinate (TOPROL XL) 25 MG 24 hr tablet Take 1 tablet (25 mg total) by mouth daily.  . mometasone (NASONEX) 50 MCG/ACT nasal spray instill 2 sprays into each nostril once daily as needed for nasal congestion  . Multiple Vitamins-Minerals (MULTIVITAMIN PO) Take 1 tablet by mouth daily.  . rosuvastatin (CRESTOR) 20 MG tablet Take 1 tablet (20 mg total) by mouth daily.   Social History   Tobacco Use  . Smoking status: Never Smoker  . Smokeless tobacco: Never Used  Substance Use Topics  . Alcohol use: Yes    Alcohol/week: 0.0 oz    Comment: Socially   Family History  Problem Relation Age of Onset  . Hyperlipidemia Mother   . Diabetes Father   . Hypertension Father   . Heart attack Father   . Stroke Father   . Heart disease Father   . Heart disease Brother   . Hypertension Brother   . Heart attack Brother   . Heart disease Brother   . Diabetes Paternal Grandfather   . Stroke Brother   . Thyroid  disease Brother      Review of Systems  Constitutional: Negative for activity change, appetite change, chills, fatigue, fever and unexpected weight change.  HENT: Negative for congestion, ear pain, hearing loss, sinus pressure, sinus pain, sore throat and trouble swallowing.   Eyes: Negative for pain and visual disturbance.  Respiratory: Negative for cough, chest tightness, shortness of breath and wheezing.   Cardiovascular: Negative for chest pain, palpitations and leg swelling.  Gastrointestinal: Negative for abdominal pain, blood in stool, constipation, diarrhea, nausea and vomiting.       Acid reflux; worse with stress. Typically well  controlled.  Genitourinary: Negative for difficulty urinating and menstrual problem.  Musculoskeletal: Negative for arthralgias and back pain.  Skin: Negative for rash.  Neurological: Negative for dizziness, weakness, numbness and headaches.  Hematological: Negative for adenopathy. Does not bruise/bleed easily.  Psychiatric/Behavioral: Negative for suicidal ideas. Sleep disturbance: Difficult time staying asleep. Wakes easily. Been that way for years. Not necessarily rested when she wakes up. Told by son she snores.  The patient is nervous/anxious (occasionally anxious; feels this is well controlled).     Objective:  BP 120/62 (BP Location: Left Arm, Patient Position: Sitting, Cuff Size: Normal)   Pulse 74   Temp 98.5 F (36.9 C) (Oral)   Ht 5' 5"  (1.651 m)   Wt 152 lb 1.6 oz (69 kg)   SpO2 97%   BMI 25.31 kg/m   Weight: 152 lb 1.6 oz (69 kg)   BP Readings from Last 3 Encounters:  10/19/17 120/62  10/09/17 113/74  10/09/17 117/85   Wt Readings from Last 3 Encounters:  10/19/17 152 lb 1.6 oz (69 kg)  07/19/17 153 lb 6.4 oz (69.6 kg)  03/20/15 140 lb (63.5 kg)    Physical Exam  Constitutional: She is oriented to person, place, and time. She appears well-developed and well-nourished. No distress.  HENT:  Head: Normocephalic and atraumatic.  Right Ear: External ear normal.  Left Ear: External ear normal.  Mouth/Throat: Oropharynx is clear and moist. No oropharyngeal exudate.  Eyes: Pupils are equal, round, and reactive to light. Conjunctivae are normal.  Neck: Normal range of motion. Neck supple. No thyromegaly present.  Cardiovascular: Normal rate, regular rhythm and normal heart sounds. Exam reveals no gallop and no friction rub.  No murmur heard. Pulmonary/Chest: Effort normal and breath sounds normal. No respiratory distress. She has no wheezes. She has no rales.  Abdominal: Soft. Bowel sounds are normal. She exhibits no distension and no mass. There is no tenderness.  There is no guarding. No hernia.  Musculoskeletal: Normal range of motion. She exhibits no edema, tenderness or deformity.  Lymphadenopathy:    She has no cervical adenopathy.  Neurological: She is alert and oriented to person, place, and time. She has normal strength. She displays normal reflexes.  Reflex Scores:      Tricep reflexes are 2+ on the right side and 2+ on the left side.      Bicep reflexes are 2+ on the right side and 2+ on the left side.      Brachioradialis reflexes are 2+ on the right side and 2+ on the left side.      Patellar reflexes are 2+ on the right side and 2+ on the left side. Skin: Skin is warm and dry. No rash noted.  Psychiatric: She has a normal mood and affect. Her speech is normal and behavior is normal. Thought content normal.    Assessment/Plan:  1. Snoring She will consider sleep  study.  Okay to order if she is willing.   2. Gastroesophageal reflux disease without esophagitis Well-controlled.  Okay to refill Dexilant if needed.  3. PPD screening test Return to the office for a read in 48 to 72 hours. - PPD  4. Need for MMR vaccine Completed - MMR vaccine subcutaneous  5. Immunity status testing - Varicella Zoster Antibody, IgG; Future - Varicella Zoster Antibody, IgG   Return in about 3 days (around 10/22/2017) for TB skin test reading.  Micheline Rough, MD

## 2017-10-22 ENCOUNTER — Ambulatory Visit: Payer: BC Managed Care – PPO | Admitting: Cardiovascular Disease

## 2017-10-22 ENCOUNTER — Encounter: Payer: Self-pay | Admitting: Cardiovascular Disease

## 2017-10-22 VITALS — BP 107/76 | HR 82 | Ht 65.0 in | Wt 150.2 lb

## 2017-10-22 DIAGNOSIS — I493 Ventricular premature depolarization: Secondary | ICD-10-CM

## 2017-10-22 DIAGNOSIS — I2510 Atherosclerotic heart disease of native coronary artery without angina pectoris: Secondary | ICD-10-CM | POA: Diagnosis not present

## 2017-10-22 HISTORY — DX: Atherosclerotic heart disease of native coronary artery without angina pectoris: I25.10

## 2017-10-22 LAB — TB SKIN TEST
Induration: 0 mm
TB Skin Test: NEGATIVE

## 2017-10-22 LAB — VARICELLA ZOSTER ANTIBODY, IGG: Varicella IgG: 3372 index

## 2017-10-22 NOTE — Patient Instructions (Signed)
Medication Instructions:  Your physician recommends that you continue on your current medications as directed. Please refer to the Current Medication list given to you today.  Labwork: FASTING LP/CMET IN 6-8 WEEKS  Testing/Procedures: NONE  Follow-Up: Your physician wants you to follow-up in: 1 [place] will receive a reminder letter in the mail two months in advance. If you don't receive a letter, please call our office to schedule the follow-up appointment.  If you need a refill on your cardiac medications before your next appointment, please call your pharmacy.

## 2017-10-22 NOTE — Progress Notes (Signed)
Cardiology Office Note   Date:  10/22/2017   ID:  Stephanie Mcfarland, DOB 1975/08/30, MRN [id number]  PCP:  Stephanie Lei, MD  Cardiologist:   Stephanie Latch, MD   No chief complaint on file.    History of Present Illness: Stephanie Mcfarland is a 42 y.o. female with non-obstructive CAD, family history of premature CAD and strokes, PVCs, allergies, and GERD here for follow up.  She was initially seen 07/2017 for the evaluation of palpitations.  Stephanie Mcfarland saw Dr. Garwin Brothers for a routine exam.  At that time she was noted to have ectopy.  She reported that she had previously seen Dr. Einar Mcfarland for palpitations.  In 2015 she wore a Holter monitor that showed occasional PVCs and was otherwise unremarkable.  She reported daily palpitations and was started on metoprolol.  Since that time she has had an improvement in her palpitations.  She no longer has them every day and is unable to determine exactly how frequently it occurs.  Sometimes it lasts for a second or 2 and sometimes lasts up to 3 minutes.  She is started exercising more regularly.  She is running 2 miles a day 3 days/week.  She also runs the stadium steps for exercise.  She has no exertional chest pain or shortness of breath with this activity.  She has not noted any lower extremity edema, orthopnea, or PND.  At her last appointment she reported atypical chest pain that occurred at rest.  Given her family history of premature CAD she was referred for coronary CT-a  CT-A that revealed mild proximal LAD, proximal LCx, and mid RCA disease.  Coronary calcium score that was 99th percentile on 09/2017.  She was started on aspirin and rosuvastatin.   Of note, she has a strong family history of cardiovascular disease.  Her father had his first MI in his 20s and had a stroke in his 3s.  Her brother had a stroke at age 10 and another brother had premature CAD requiring CABG.    Past Medical History:  Diagnosis Date  . GERD (gastroesophageal reflux disease)   .  Nonobstructive atherosclerosis of coronary artery 10/22/2017  . PAC (premature atrial contraction) 07/19/2017  . Palpitations 07/19/2017  . Seasonal allergies     Past Surgical History:  Procedure Laterality Date  . NO PAST SURGERIES       Current Outpatient Medications  Medication Sig Dispense Refill  . aspirin EC 81 MG tablet Take 81 mg by mouth daily.    . cetirizine (ZYRTEC) 10 MG tablet Take 10 mg by mouth daily as needed for allergies.    . DEXILANT 60 MG capsule Take 60 mg by mouth daily.   1  . methylPREDNISolone Acetate (DEPO-MEDROL IJ) Inject as directed.    . metoprolol succinate (TOPROL XL) 25 MG 24 hr tablet Take 1 tablet (25 mg total) by mouth daily. 90 tablet 1  . mometasone (NASONEX) 50 MCG/ACT nasal spray instill 2 sprays into each nostril once daily as needed for nasal congestion  0  . Multiple Vitamins-Minerals (MULTIVITAMIN PO) Take 1 tablet by mouth daily.    . rosuvastatin (CRESTOR) 20 MG tablet Take 1 tablet (20 mg total) by mouth daily. 90 tablet 3   No current facility-administered medications for this visit.     Allergies:   Sulfa antibiotics    Social History:  The patient  reports that she has never smoked. She has never used smokeless tobacco. She reports that she drinks  alcohol. She reports that she does not use drugs.   Family History:  The patient's family history includes Diabetes in her father and paternal grandfather; Heart attack in her brother and father; Heart disease in her brother, brother, and father; Hyperlipidemia in her mother; Hypertension in her brother and father; Stroke in her brother and father; Thyroid disease in her brother.    ROS:  Please see the history of present illness.   Otherwise, review of systems are positive for none.   All other systems are reviewed and negative.    PHYSICAL EXAM: VS:  BP 107/76   Pulse 82   Ht 5\' 5"  (1.651 m)   Wt 150 lb 3.2 oz (68.1 kg)   SpO2 100%   BMI 24.99 kg/m  , BMI Body mass index is 24.99  kg/m. GENERAL:  Well appearing HEENT: Pupils equal round and reactive, fundi not visualized, oral mucosa unremarkable NECK:  No jugular venous distention, waveform within normal limits, carotid upstroke brisk and symmetric, no bruits LUNGS:  Clear to auscultation bilaterally HEART:  RRR.  PMI not displaced or sustained,S1 and S2 within normal limits, no S3, no S4, no clicks, no rubs, no murmurs ABD:  Flat, positive bowel sounds normal in frequency in pitch, no bruits, no rebound, no guarding, no midline pulsatile mass, no hepatomegaly, no splenomegaly EXT:  2 plus pulses throughout, no edema, no cyanosis no clubbing SKIN:  No rashes no nodules NEURO:  Cranial nerves II through XII grossly intact, motor grossly intact throughout PSYCH:  Cognitively intact, oriented to person place and time   EKG:  EKG is not ordered today. The ekg ordered 07/19/17 demonstrates Sinus rhythm.  Rate 81 bpm.  48-hour Holter 06/2013:  16 PVCs noted.  2 PACs.  Coronary CT-A 10/09/17: IMPRESSION: 1. Extensive plaque for age and gender, but there appears to be no more than mild stenosis.  2. Coronary artery calcium score 469 Agatston units, placing the patient in the 99th percentile for age and gender. This suggests high risk for future cardiac events.  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  Given that her blood pressure is low and her symptoms have  improved we will not increase her metoprolol at this time.etter controlled on metoprolol   # Non-obstructive CAD: # Family history of premature CVD:  # Hyperlipidemia:  Coronary calcium score is in the 99th percentile.  She has been started on aspirin and rosuvastatin.  She did not have any obstructive coronary disease.  She will return for lipids and a CMP in 6 to 8 weeks.   Current medicines are reviewed at length with the patient today.  The patient does not have concerns regarding medicines.  The following changes have been made: none  Labs/ tests ordered today include:   No orders of the defined types were placed in this encounter.    Disposition:  FU with Jennalynn Rivard C. Oval Linsey, MD, [facility] in 1 year.    Signed, Venia Riveron C. Oval Linsey, MD, [facility]  10/22/2017 8:47 AM    [facility]

## 2017-10-24 ENCOUNTER — Ambulatory Visit: Payer: BC Managed Care – PPO

## 2017-10-24 NOTE — Progress Notes (Signed)
LM 8/7

## 2017-10-26 ENCOUNTER — Other Ambulatory Visit: Payer: BC Managed Care – PPO

## 2017-10-29 ENCOUNTER — Ambulatory Visit (INDEPENDENT_AMBULATORY_CARE_PROVIDER_SITE_OTHER): Payer: BC Managed Care – PPO | Admitting: Family Medicine

## 2017-10-29 DIAGNOSIS — Z23 Encounter for immunization: Secondary | ICD-10-CM

## 2017-10-31 LAB — TB SKIN TEST
Induration: 0 mm
TB Skin Test: NEGATIVE

## 2017-12-07 LAB — LIPID PANEL
Chol/HDL Ratio: 2.2 ratio (ref 0.0–4.4)
Cholesterol, Total: 132 mg/dL (ref 100–199)
HDL: 59 mg/dL (ref >39)
LDL Calculated: 65 mg/dL (ref 0–99)
Triglycerides: 39 mg/dL (ref 0–149)
VLDL Cholesterol Cal: 8 mg/dL (ref 5–40)

## 2017-12-07 LAB — COMPREHENSIVE METABOLIC PANEL
ALT: 11 IU/L (ref 0–32)
AST: 18 IU/L (ref 0–40)
Albumin/Globulin Ratio: 1.8 (ref 1.2–2.2)
Albumin: 4.4 g/dL (ref 3.5–5.5)
Alkaline Phosphatase: 56 IU/L (ref 39–117)
BUN/Creatinine Ratio: 14 (ref 9–23)
BUN: 11 mg/dL (ref 6–24)
Bilirubin Total: 0.4 mg/dL (ref 0.0–1.2)
CO2: 22 mmol/L (ref 20–29)
Calcium: 9.5 mg/dL (ref 8.7–10.2)
Chloride: 103 mmol/L (ref 96–106)
Creatinine, Ser: 0.78 mg/dL (ref 0.57–1.00)
GFR calc Af Amer: 109 mL/min/{1.73_m2} (ref >59)
GFR calc non Af Amer: 95 mL/min/{1.73_m2} (ref >59)
Globulin, Total: 2.4 g/dL (ref 1.5–4.5)
Glucose: 90 mg/dL (ref 65–99)
Potassium: 4.6 mmol/L (ref 3.5–5.2)
Sodium: 139 mmol/L (ref 134–144)
Total Protein: 6.8 g/dL (ref 6.0–8.5)

## 2017-12-11 ENCOUNTER — Encounter: Payer: Self-pay | Admitting: Family Medicine

## 2017-12-11 ENCOUNTER — Ambulatory Visit (INDEPENDENT_AMBULATORY_CARE_PROVIDER_SITE_OTHER): Payer: BC Managed Care – PPO

## 2017-12-11 ENCOUNTER — Ambulatory Visit: Payer: BC Managed Care – PPO | Admitting: Family Medicine

## 2017-12-11 ENCOUNTER — Other Ambulatory Visit: Payer: Self-pay

## 2017-12-11 VITALS — BP 110/70 | HR 92 | Temp 98.8°F | Ht 65.0 in | Wt 150.3 lb

## 2017-12-11 DIAGNOSIS — R0981 Nasal congestion: Secondary | ICD-10-CM | POA: Diagnosis not present

## 2017-12-11 DIAGNOSIS — R0989 Other specified symptoms and signs involving the circulatory and respiratory systems: Secondary | ICD-10-CM

## 2017-12-11 DIAGNOSIS — R0689 Other abnormalities of breathing: Secondary | ICD-10-CM

## 2017-12-11 MED ORDER — MOMETASONE FUROATE 50 MCG/ACT NA SUSP: NASAL | 0 refills | Status: DC

## 2017-12-11 NOTE — Progress Notes (Signed)
  Subjective:     Patient ID: Stephanie Mcfarland, female   DOB: [birth year], 41 y.o.   MRN: [id number]  HPI Patient seen as a work in with onset last Friday of some sinus pressure, nasal congestion, intermittent sneezing.  She had some initial mild sore throat but none now.  No cough.  No fevers or chills.  She has had some pain and pressure mostly right frontal sinus region.  Only occasional headaches.  She tried multiple allergy medications including Claritin, Flonase, and Benadryl without much improvement.  Symptoms somewhat progressive. Patient has never smoked.  Past Medical History:  Diagnosis Date  . GERD (gastroesophageal reflux disease)   . Nonobstructive atherosclerosis of coronary artery 10/22/2017  . PAC (premature atrial contraction) 07/19/2017  . Palpitations 07/19/2017  . Seasonal allergies    Past Surgical History:  Procedure Laterality Date  . NO PAST SURGERIES      reports that she has never smoked. She has never used smokeless tobacco. She reports that she drinks alcohol. She reports that she does not use drugs. family history includes Diabetes in her father and paternal grandfather; Heart attack in her brother and father; Heart disease in her brother, brother, and father; Hyperlipidemia in her mother; Hypertension in her brother and father; Stroke in her brother and father; Thyroid disease in her brother. Allergies  Allergen Reactions  . Sulfa Antibiotics Itching and Rash    Rash, itching, bumps     Review of Systems  Constitutional: Positive for fatigue. Negative for chills and fever.  HENT: Positive for congestion, sinus pressure, sinus pain and sneezing.   Respiratory: Negative for cough, shortness of breath and wheezing.   Cardiovascular: Negative for chest pain.       Objective:   Physical Exam  Constitutional: She appears well-developed and well-nourished.  HENT:  Right Ear: External ear normal.  Left Ear: External ear normal.  Mouth/Throat: Oropharynx is  clear and moist.  Neck: Neck supple.  Cardiovascular: Normal rate and regular rhythm.  Pulmonary/Chest: Effort normal. No respiratory distress. She has no wheezes. She has no rales.  Patient has some coarse breath sounds right upper lobe posteriorly.  No rales.  No wheezes.  Lymphadenopathy:    She has no cervical adenopathy.       Assessment:     Patient presents with one-week history of progressive sinus symptoms.  Differential is viral versus early bacterial.  She does have some mild asymmetric breath sounds as above of uncertain significance    Plan:     -Obtain chest x-ray-no acute abnormality seen.  This will be over read by radiology -Refilled Nasonex -Consider saline nasal irrigation -Follow-up if symptoms not improving over the next week  Eulas Post MD [facility]

## 2017-12-11 NOTE — Patient Instructions (Signed)
Stay well hydrated  Consider plain mucinex  Get back on the Nasonex  If not improving over next few day to week let me know

## 2017-12-19 ENCOUNTER — Encounter: Payer: Self-pay | Admitting: Family Medicine

## 2017-12-19 ENCOUNTER — Ambulatory Visit: Payer: BC Managed Care – PPO | Admitting: Family Medicine

## 2017-12-19 VITALS — BP 120/62 | HR 74 | Temp 98.3°F | Wt 150.6 lb

## 2017-12-19 DIAGNOSIS — G4700 Insomnia, unspecified: Secondary | ICD-10-CM | POA: Diagnosis not present

## 2017-12-19 DIAGNOSIS — F419 Anxiety disorder, unspecified: Secondary | ICD-10-CM

## 2017-12-19 MED ORDER — ALPRAZOLAM 0.25 MG PO TABS: 0.2500 mg | ORAL_TABLET | Freq: Two times a day (BID) | ORAL | 0 refills | Status: DC | PRN

## 2017-12-19 MED ORDER — SUVOREXANT 10 MG PO TABS: 10.0000 mg | ORAL_TABLET | Freq: Every evening | ORAL | 5 refills | Status: DC | PRN

## 2017-12-19 NOTE — Patient Instructions (Signed)

## 2017-12-19 NOTE — Progress Notes (Signed)
Stephanie Mcfarland DOB: Dec 28, 1975 Encounter date: 12/19/2017  This is a 42 y.o. female who presents with Chief Complaint  Patient presents with  . Insomnia    trouble staying asleep, pt states she is able to fall asleep but wakes up multiple times a night and unable to go back to sleep, tried OTC sleep aids    History of present illness:  Usually able to fall asleep ok; just waking up frequently and difficulty falling back asleep.   Has been going on for years. Max sleep is 4 hours (that's a good night). Sometimes feels rested if she is able to get a 3 hour sleep.   Has tried "everything" to help with sleep. Tried ambien in past. Didn't work for her. When tried longer acting made her groggy. Has tried melatonin, lavendar, tea, music, black out room. Tried reading. White noise. Tried trazodone - doesn't think it was effective longer term. Tried benadryl, tried OTC liquid zquil. Some of those will help with initial sleep but not staying asleep. Tried lunesta.   Not sure what wakes her at night.   Usually will not go to bed until midnight because if she goes to bed any early then she wakes up earlier. Usually gets up at 6 to start day.     Allergies  Allergen Reactions  . Sulfa Antibiotics Itching and Rash    Rash, itching, bumps   Current Meds  Medication Sig  . aspirin EC 81 MG tablet Take 81 mg by mouth daily.  . cetirizine (ZYRTEC) 10 MG tablet Take 10 mg by mouth daily as needed for allergies.  . DEXILANT 60 MG capsule Take 60 mg by mouth as needed.   . methylPREDNISolone Acetate (DEPO-MEDROL IJ) Inject as directed.  . mometasone (NASONEX) 50 MCG/ACT nasal spray instill 2 sprays into each nostril once daily as needed for nasal congestion  . Multiple Vitamins-Minerals (MULTIVITAMIN PO) Take 1 tablet by mouth daily.  . rosuvastatin (CRESTOR) 20 MG tablet Take 1 tablet (20 mg total) by mouth daily.  . [DISCONTINUED] metoprolol succinate (TOPROL XL) 25 MG 24 hr tablet Take 1 tablet  (25 mg total) by mouth daily.    Review of Systems  Constitutional: Negative for chills, fatigue and fever.  Respiratory: Negative for cough, chest tightness, shortness of breath and wheezing.   Cardiovascular: Negative for chest pain, palpitations and leg swelling.  Neurological: Negative for headaches.       Occasional snoring; but not regular  Psychiatric/Behavioral: Positive for sleep disturbance.    Objective:  BP 120/62 (BP Location: Left Arm, Patient Position: Sitting, Cuff Size: Normal)   Pulse 74   Temp 98.3 F (36.8 C) (Oral)   Wt 150 lb 9.6 oz (68.3 kg)   SpO2 95%   BMI 25.06 kg/m   Weight: 150 lb 9.6 oz (68.3 kg)   BP Readings from Last 3 Encounters:  12/19/17 120/62  12/11/17 110/70  10/22/17 107/76   Wt Readings from Last 3 Encounters:  12/19/17 150 lb 9.6 oz (68.3 kg)  12/11/17 150 lb 4.8 oz (68.2 kg)  10/22/17 150 lb 3.2 oz (68.1 kg)    Physical Exam  Constitutional: She appears well-developed and well-nourished. No distress.  HENT:  Head: Normocephalic and atraumatic.  Mouth/Throat: Uvula is midline, oropharynx is clear and moist and mucous membranes are normal. No oropharyngeal exudate, posterior oropharyngeal edema or posterior oropharyngeal erythema.  Cardiovascular: Normal rate, regular rhythm and normal heart sounds. Exam reveals no friction rub.  No murmur heard.  No lower extremity edema  Pulmonary/Chest: Effort normal and breath sounds normal. No respiratory distress. She has no wheezes. She has no rales.  Psychiatric: Her mood appears not anxious.  Worried about upcoming flight/vacation. Gets very anxious. Getting really stressed out about it. Wanting something to help with flight.     Assessment/Plan 1. Insomnia, unspecified type Ref to sleep center for further evaluation; she is willing to do behavioral therapy for sleep which I think would be most helpful.  - Suvorexant (BELSOMRA) 10 MG TABS; Take 10 mg by mouth at bedtime as needed.   Dispense: 30 tablet; Refill: 5 - Ambulatory referral to Sleep Studies  2. Anxiety Just as it relates to flying. Xanax for use just before flight. Do not mix with alcohol. Do not take if not needed.   Declines flu shot     Micheline Rough, MD

## 2017-12-28 ENCOUNTER — Telehealth: Payer: Self-pay | Admitting: Family Medicine

## 2017-12-28 NOTE — Telephone Encounter (Signed)
PA for Belsomra initiated via CoverMyMeds. Awaiting determination. Key: [place] patient to verify that she has tried and failed short-acting benzo, as this was one of the questions on PA, and patient stated she has tried and failed alprazolam.

## 2017-12-28 NOTE — Telephone Encounter (Signed)
I do not see that this has been started.

## 2017-12-28 NOTE — Telephone Encounter (Signed)
Copied from [place] [id number]. Topic: General - Other >> Dec 28, 2017 10:43 AM Carolyn Stare wrote:  Suvorexant (BELSOMRA) 10 MG TABS  Pt is asking if PA was submitted to insurance company she contacted them and they don't have a PA

## 2018-01-02 NOTE — Telephone Encounter (Signed)
Fax received from [place] stating the request was approved from 12/28/2017-12/28/2020.  I called [facility], informed Ulice Brilliant of this and she stated they will contact the pt.

## 2018-01-23 ENCOUNTER — Telehealth: Payer: Self-pay

## 2018-01-23 NOTE — Telephone Encounter (Signed)
Patient called back and said she seen in her mychart that she had it done at [place]..please call patient back. She said it was administered October 19, 2017.

## 2018-01-23 NOTE — Telephone Encounter (Signed)
Left a detailed message explaining that the MMR titre was not done in this office and that Stephanie Mcfarland took the paper work necessary to enter this with her when she left the visit. Patient was asked to contact the office where the titre had been done or bring back the paper work for [place] to enter it.  CMR created

## 2018-01-23 NOTE — Telephone Encounter (Signed)
Copied from [place] [id number]. Topic: General - Other >> Jan 23, 2018 11:18 AM Burchel, Abbi R wrote: Pt requesting to have her MMR Titre entered in into [facility] bc it isn't showing up when she searches.    [phone]

## 2018-01-24 NOTE — Telephone Encounter (Signed)
Left a detailed message explaining that MMR titre was done here in our office. This was on the paper that she brought with her to her last appointment. [facility] does not allow [place] to put in a Titre only immunizations.

## 2018-02-11 ENCOUNTER — Encounter: Payer: Self-pay | Admitting: Neurology

## 2018-02-11 ENCOUNTER — Ambulatory Visit: Payer: BC Managed Care – PPO | Admitting: Neurology

## 2018-02-11 VITALS — BP 132/84 | HR 81 | Ht 65.5 in | Wt 155.0 lb

## 2018-02-11 DIAGNOSIS — G479 Sleep disorder, unspecified: Secondary | ICD-10-CM | POA: Diagnosis not present

## 2018-02-11 DIAGNOSIS — G4719 Other hypersomnia: Secondary | ICD-10-CM | POA: Diagnosis not present

## 2018-02-11 DIAGNOSIS — G478 Other sleep disorders: Secondary | ICD-10-CM | POA: Diagnosis not present

## 2018-02-11 DIAGNOSIS — G4700 Insomnia, unspecified: Secondary | ICD-10-CM

## 2018-02-11 NOTE — Patient Instructions (Signed)
Please remember to try to maintain good sleep hygiene, which means: Keep a regular sleep and wake schedule, try not to exercise or have a meal within 2 hours of your bedtime, try to keep your bedroom conducive for sleep, that is, cool and dark, without light distractors such as an illuminated alarm clock, and refrain from watching TV right before sleep or in the middle of the night and do not keep the TV or radio on during the night. Also, try not to use or play on electronic devices at bedtime, such as your cell phone, tablet PC or laptop. If you like to read at bedtime on an electronic device, try to dim the background light as much as possible. Do not eat in the middle of the night.   We will request a sleep study.    We will look for leg twitching and snoring or sleep apnea.   For chronic insomnia, you may benefit from seeing a psychologist for consideration of cognitive behavioral therapy (CBT-I).   We will call you with the sleep study results and make a follow up appointment if needed.

## 2018-02-11 NOTE — Progress Notes (Signed)
Subjective:    Patient ID: Stephanie Mcfarland is a 42 y.o. female.  HPI     Star Age, MD, PhD [facility] [address], [address]O. [place], [place] [address]  Dear Dr. Ethlyn Gallery,   I saw your patient, Stephanie Mcfarland, upon your kind request in my sleep clinic today for initial consultation of her sleep disorder, in particular, difficulty with maintaining sleep. The patient is unaccompanied today. As you know, Stephanie Mcfarland is a 81 year old right-handed woman with an underlying benign medical history,  who reports a several year history of trouble maintaining sleep, likely over 10 years duration. No obvious trigger, no family history of insomnia, no family history of OSA. She denies telltale snoring, has never been told she snores, does not wake up with a headache, has nocturia about once per average night but typically she is awake anyway. She does not have significant trouble falling asleep. She tries to have a bedtime of around 10 or 11 currently but previously was in bed later, around 1 AM. Her rise time is around 6. She denies telltale symptoms of restless leg syndrome. She has not been told that she twitches her kicks in her sleep. She has no history of sleep talking or sleep walking. She does believe she has dreams but rarely. She has tried over-the-counter medication as well as prescription medication including trazodone and Ambien and more recently she was given a prescription for Belsomra. I reviewed your office note from 12/19/2017. She is no longer on the Belsomra, as it did not help, she tried 10 mg strength for about 2 weeks.  Epworth sleepiness score is 13 out of 24 today, fatigue score is 23 out of 63. She is single and lives with her son, who is currently in college, she works in an Data processing manager position at [employer]. She is a nonsmoker and drinks alcohol socially, does not utilize caffeine on a regular basis. She does nap sometimes, but is not refreshed. She  does not have any pets, she does not watch TV in her bedroom.  Her Past Medical History Is Significant For: Past Medical History:  Diagnosis Date  . GERD (gastroesophageal reflux disease)   . Nonobstructive atherosclerosis of coronary artery 10/22/2017  . PAC (premature atrial contraction) 07/19/2017  . Palpitations 07/19/2017  . Seasonal allergies     Her Past Surgical History Is Significant For: Past Surgical History:  Procedure Laterality Date  . NO PAST SURGERIES      Her Family History Is Significant For: Family History  Problem Relation Age of Onset  . Hyperlipidemia Mother   . Diabetes Father   . Hypertension Father   . Heart attack Father   . Stroke Father   . Heart disease Father   . Heart disease Brother   . Hypertension Brother   . Heart attack Brother   . Heart disease Brother   . Diabetes Paternal Grandfather   . Stroke Brother   . Thyroid disease Brother     Her Social History Is Significant For: Social History   Socioeconomic History  . Marital status: Single    Spouse name: Not on file  . Number of children: 1  . Years of education: 51  . Highest education level: Not on file  Occupational History  . Occupation: Proofreader   Social Needs  . Financial resource strain: Not on file  . Food insecurity:    Worry: Not on file    Inability: Not on file  .  Transportation needs:    Medical: Not on file    Non-medical: Not on file  Tobacco Use  . Smoking status: Never Smoker  . Smokeless tobacco: Never Used  Substance and Sexual Activity  . Alcohol use: Yes    Alcohol/week: 0.0 standard drinks    Comment: Socially  . Drug use: No  . Sexual activity: Not on file  Lifestyle  . Physical activity:    Days per week: Not on file    Minutes per session: Not on file  . Stress: Not on file  Relationships  . Social connections:    Talks on phone: Not on file    Gets together: Not on file    Attends religious service: Not on file    Active member  of club or organization: Not on file    Attends meetings of clubs or organizations: Not on file    Relationship status: Not on file  Other Topics Concern  . Not on file  Social History Narrative   Lives at home with son   CAffeine use- 2 times per month    Her Allergies Are:  Allergies  Allergen Reactions  . Sulfa Antibiotics Itching and Rash    Rash, itching, bumps  :   Her Current Medications Are:  Outpatient Encounter Medications as of 02/11/2018  Medication Sig  . aspirin EC 81 MG tablet Take 81 mg by mouth daily.  . cetirizine (ZYRTEC) 10 MG tablet Take 10 mg by mouth daily as needed for allergies.  . DEXILANT 60 MG capsule Take 60 mg by mouth as needed.   . methylPREDNISolone Acetate (DEPO-MEDROL IJ) Inject as directed.  . mometasone (NASONEX) 50 MCG/ACT nasal spray instill 2 sprays into each nostril once daily as needed for nasal congestion  . Multiple Vitamins-Minerals (MULTIVITAMIN PO) Take 1 tablet by mouth daily.  . rosuvastatin (CRESTOR) 20 MG tablet Take 1 tablet (20 mg total) by mouth daily.  . [DISCONTINUED] ALPRAZolam (XANAX) 0.25 MG tablet Take 1 tablet (0.25 mg total) by mouth 2 (two) times daily as needed for anxiety.  . [DISCONTINUED] Suvorexant (BELSOMRA) 10 MG TABS Take 10 mg by mouth at bedtime as needed.   No facility-administered encounter medications on file as of 02/11/2018.   :  Review of Systems:  Out of a complete 14 point review of systems, all are reviewed and negative with the exception of these symptoms as listed below: Review of Systems  Neurological:       Pt presents today to discuss her sleep. Pt has never had a sleep study and doesn't know if she snores.  Epworth Sleepiness Scale 0= would never doze 1= slight chance of dozing 2= moderate chance of dozing 3= high chance of dozing  Sitting and reading: 3 Watching TV: 2 Sitting inactive in a public place (ex. Theater or meeting): 1 As a passenger in a car for an hour without a break:  2 Lying down to rest in the afternoon: 3 Sitting and talking to someone: 0 Sitting quietly after lunch (no alcohol): 2 In a car, while stopped in traffic: 0 Total: 13     Objective:  Neurological Exam  Physical Exam Physical Examination:   Vitals:   02/11/18 1530  BP: 132/84  Pulse: 81    General Examination: The patient is a very pleasant 42 y.o. female in no acute distress. She appears well-developed and well-nourished and well groomed.   HEENT: Normocephalic, atraumatic, pupils are equal, round and reactive to light and accommodation.  Extraocular tracking is good without limitation to gaze excursion or nystagmus noted. Normal smooth pursuit is noted. Hearing is grossly intact. Face is symmetric with normal facial animation and normal facial sensation. Speech is clear with no dysarthria noted. There is no hypophonia. There is no lip, neck/head, jaw or voice tremor. Neck is supple with full range of passive and active motion. There are no carotid bruits on auscultation. Oropharynx exam reveals: mild mouth dryness, adequate dental hygiene and moderate airway crowding secondary to tonsillar size of 2+ bilaterally, longer uvula and elongated tongue. Mallampati is class II. Neck circumference is 13-1/2 inches. She has a mild overbite. Tongue protrudes centrally and palate elevates symmetrically.  Chest: Clear to auscultation without wheezing, rhonchi or crackles noted.  Heart: S1+S2+0, regular and normal without murmurs, rubs or gallops noted.   Abdomen: Soft, non-tender and non-distended with normal bowel sounds appreciated on auscultation.  Extremities: There is no pitting edema in the distal lower extremities bilaterally. Pedal pulses are intact.  Skin: Warm and dry without trophic changes noted.  Musculoskeletal: exam reveals no obvious joint deformities, tenderness or joint swelling or erythema.   Neurologically:  Mental status: The patient is awake, alert and oriented in all  4 spheres. Her immediate and remote memory, attention, language skills and fund of knowledge are appropriate. There is no evidence of aphasia, agnosia, apraxia or anomia. Speech is clear with normal prosody and enunciation. Thought process is linear. Mood is normal and affect is normal.  Cranial nerves II - XII are as described above under HEENT exam. In addition: shoulder shrug is normal with equal shoulder height noted. Motor exam: Normal bulk, strength and tone is noted. There is no drift, tremor or rebound. Romberg is negative. Fine motor skills and coordination: grossly intact with normal finger taps, normal hand movements, normal rapid alternating patting, normal foot taps and normal foot agility.  Cerebellar testing: No dysmetria or intention tremor on finger to nose testing. Heel to shin is unremarkable bilaterally. There is no truncal or gait ataxia.  Sensory exam: intact to light touch. Gait, station and balance: She stands easily. No veering to one side is noted. No leaning to one side is noted. Posture is age-appropriate and stance is narrow based. Gait shows normal stride length and normal pace. No problems turning are noted. Tandem walk is unremarkable.      Assessment and Plan:  In summary, Avory Rahimi Gully is a very pleasant 42 y.o.-year old female with an underlying benign medical history, who presents for a sleep evaluation. She has chronic difficulty with sleep maintenance, no telltale history suggestive of underlying obstructive sleep apnea but she does have a crowded appearing airway. Nevertheless, would like to proceed with a overnight polysomnogram to exclude an underlying organic cause of her sleep maintenance difficulty. She has tried several prescription medications for this, most recently Belsomra. Sure her sleep study be benign, she may benefit from evaluation by a sleep psychologist, for consideration of cognitive behavioral therapy. We talked about the importance of keeping good  sleep hygiene. I recommended the following at this time: sleep study with potential positive airway pressure titration. (We will score hypopneas at 3%). I answered all her questions today and the patient was in agreement. I plan to see her back after the sleep study is completed and encouraged her to call with any interim questions, concerns, problems or updates.   Thank you very much for allowing me to participate in the care of this nice patient. If I  can be of any further assistance to you please do not hesitate to call me at [phone].  Sincerely,   Star Age, MD, PhD

## 2018-02-22 NOTE — Telephone Encounter (Signed)
Patient called back about MMR titre/injection-patient states she was billed for injection. Patient is requesting a call back to discuss further.

## 2018-02-25 NOTE — Telephone Encounter (Signed)
Per pt account the MMR vaccine was billed to her insurance company, not to the patient. She has a pt responsibility of $15 which is likely a co-pay amount and may have already been paid on the date of service.

## 2018-02-26 NOTE — Telephone Encounter (Signed)
  I spoke to the patient several weeks ago when I received a Patient call stating that the patient was asking for her MMR Titre to be put in [place]. I called the patient and verified that she was asking about the titre and not the injection itself, patient confirmed that she was asking about the titre. Patient was advised at that time that we can not put titre's in [place]. Patient did not have her MMR titre drawn here and was advised to contact the office that she had this done at if she needed documentation for school.  When speaking to the patient yesterday at 5:00pm, the patient stated she had asked about the injection not the titre and we told her it had not been done here. As a result the patient received a second MMR vaccine from the health department. I read the message that I had received back to the patient several times and she was adamant that this was our mistake. Patient was also informed that she was only responsible for her $15 copay with [place], that we had not sent any bills to her and if she had received a bill it may have been from her insurance company. Patient was advised to contact her insurance in regards to the bill.

## 2018-03-18 ENCOUNTER — Ambulatory Visit (INDEPENDENT_AMBULATORY_CARE_PROVIDER_SITE_OTHER): Payer: BC Managed Care – PPO | Admitting: Neurology

## 2018-03-18 DIAGNOSIS — G4719 Other hypersomnia: Secondary | ICD-10-CM

## 2018-03-18 DIAGNOSIS — G4710 Hypersomnia, unspecified: Secondary | ICD-10-CM

## 2018-03-18 DIAGNOSIS — G4720 Circadian rhythm sleep disorder, unspecified type: Secondary | ICD-10-CM

## 2018-03-18 DIAGNOSIS — G478 Other sleep disorders: Secondary | ICD-10-CM

## 2018-03-18 DIAGNOSIS — G479 Sleep disorder, unspecified: Secondary | ICD-10-CM

## 2018-03-18 DIAGNOSIS — G4700 Insomnia, unspecified: Secondary | ICD-10-CM

## 2018-03-21 ENCOUNTER — Telehealth: Payer: Self-pay

## 2018-03-21 NOTE — Procedures (Signed)
PATIENT'S NAME:  Stephanie Mcfarland, Stephanie Mcfarland DOB:      21-Oct-1975      MR#:    [id number]     DATE OF RECORDING: 03/18/2018 REFERRING M.D.:  Micheline Rough, MD Study Performed:   Baseline Polysomnogram HISTORY: 43 year old woman with a benign medical history, who reports a several year history of trouble maintaining sleep, likely over 10 years duration. Her Epworth sleepiness score is 13 out of 24. The patient's weight 155 pounds with a height of 66 (inches), resulting in a BMI of 25.4 kg/m2. The patient's neck circumference measured 13.5 inches.  CURRENT MEDICATIONS: Aspirin, Cetirizine, Dexilant, Methyiprednisolone, Mometasone, Multi-Vitamin and Rosuvastatin.   PROCEDURE:  This is a multichannel digital polysomnogram utilizing the Somnostar 11.2 system.  Electrodes and sensors were applied and monitored per AASM Specifications.   EEG, EOG, Chin and Limb EMG, were sampled at 200 Hz.  ECG, Snore and Nasal Pressure, Thermal Airflow, Respiratory Effort, CPAP Flow and Pressure, Oximetry was sampled at 50 Hz. Digital video and audio were recorded.      BASELINE STUDY  Lights Out was at 22:16 and Lights On at 05:02.  Total recording time (TRT) was 406.5 minutes, with a total sleep time (TST) of 243.5 minutes.   The patient's sleep latency was 124.5 minutes, which is delayed. REM latency was 115.5 minutes, which is high normal. The sleep efficiency was 59.9%, which is reduced.     SLEEP ARCHITECTURE: WASO (Wake after sleep onset) was 44 minutes with mild sleep fragmentation noted. There were 9 minutes in Stage N1, 165 minutes Stage N2, 31.5 minutes Stage N3 and 38 minutes in Stage REM.  The percentage of Stage N1 was 3.7%, Stage N2 was 67.8%, which is increased, Stage N3 was 12.9% and Stage R (REM sleep) was 15.6%, which is mildly reduced. The arousals were noted as: 20 were spontaneous, 0 were associated with PLMs, 3 were associated with respiratory events.  RESPIRATORY ANALYSIS:  There were a total of 3  respiratory events:  1 obstructive apneas, 0 central apneas and 0 mixed apneas with a total of 1 apneas and an apnea index (AI) of .2 /hour. There were 2 hypopneas with a hypopnea index of .5 /hour. The patient also had 0 respiratory event related arousals (RERAs).      The total APNEA/HYPOPNEA INDEX (AHI) was .7/hour and the total RESPIRATORY DISTURBANCE INDEX was .7 /hour.  2 events occurred in REM sleep and 2 events in NREM. The REM AHI was 3.2 /hour, versus a non-REM AHI of .3. The patient spent 49.5 minutes of total sleep time in the supine position and 194 minutes in non-supine.. The supine AHI was 0.0 versus a non-supine AHI of 0.9.  OXYGEN SATURATION & C02:  The Wake baseline 02 saturation was 97%, with the lowest being 91%. Time spent below 89% saturation equaled 0 minutes.   PERIODIC LIMB MOVEMENTS: The patient had a total of 0 Periodic Limb Movements.  The Periodic Limb Movement (PLM) index was 0 and the PLM Arousal index was 0/hour.  Audio and video analysis did not show any abnormal or unusual movements, behaviors, phonations or vocalizations. The patient took no bathroom breaks. Very mild and intermittent snoring was noted. The EKG was in keeping with normal sinus rhythm (NSR).  Post-study, the patient indicated that sleep was the same as usual.   IMPRESSION:  1. Dysfunctions associated with sleep stages or arousal from sleep  RECOMMENDATIONS:  1. This study does not demonstrate any significant obstructive or central sleep  disordered breathing, nor any other obvious organic sleep disorder, such as periodic leg movements.  2. This study shows delay in sleep onset and some sleep fragmentation and mildly abnormal sleep stage percentages; these are nonspecific findings and per se do not signify an intrinsic sleep disorder or a cause for the patient's sleep-related symptoms. Causes include (but are not limited to) the first night effect of the sleep study, circadian rhythm disturbances,  medication effect or an underlying mood disorder or medical problem. For chronic insomnia, the patient will be advised to consider cognitive behavioral therapy (CBT-I).  3. The patient should be cautioned not to drive, work at heights, or operate dangerous or heavy equipment when tired or sleepy. Review and reiteration of good sleep hygiene measures should be pursued with any patient. 4. The patient will be advised to follow up with her referring provider, who will be notified of the test results.  I certify that I have reviewed the entire raw data recording prior to the issuance of this report in accordance with the Standards of Accreditation of the American Academy of Sleep Medicine (AASM)   Star Age, MD, PhD Diplomat, American Board of Neurology and Sleep Medicine (Neurology and Sleep Medicine)

## 2018-03-21 NOTE — Telephone Encounter (Signed)
I called pt and discussed her sleep study results with her. Pt is agreeable to pursuing CBT-I and will discuss this with her PCP, Dr. Ethlyn Gallery. Pt is aware that a copy of this sleep study will be sent to Dr. Ethlyn Gallery. Pt verbalized understanding of results. Pt had no questions at this time but was encouraged to call back if questions arise.

## 2018-03-21 NOTE — Progress Notes (Signed)
Patient referred by Dr. Ethlyn Gallery, seen by me on 02/11/18, diagnostic PSG on 03/18/18.   Please call and notify the patient that the recent sleep study did not show any significant obstructive sleep apnea nor any other obvious organic sleep disorder, such as periodic leg movements. She had trouble falling asleep, but achieved all stages of sleep. Since she reports a longstanding Hx of insomnia (primarily for sleep maintenance), I would recommend, she consider cognitive behavioral therapy (CBT-I) through a psychologist. We had brushed upon this during our appt. She can d/w with PCP and follow up with her. We will cc Dr. Ethlyn Gallery on the sleep study report.   Thanks,  Star Age, MD, PhD [facility])

## 2018-03-21 NOTE — Telephone Encounter (Signed)
-----   Message from Star Age, MD sent at 03/21/2018  8:28 AM EST ----- Patient referred by Dr. Ethlyn Gallery, seen by me on 02/11/18, diagnostic PSG on 03/18/18.   Please call and notify the patient that the recent sleep study did not show any significant obstructive sleep apnea nor any other obvious organic sleep disorder, such as periodic leg movements. She had trouble falling asleep, but achieved all stages of sleep. Since she reports a longstanding Hx of insomnia (primarily for sleep maintenance), I would recommend, she consider cognitive behavioral therapy (CBT-I) through a psychologist. We had brushed upon this during our appt. She can d/w with PCP and follow up with her. We will cc Dr. Ethlyn Gallery on the sleep study report.   Thanks,  Star Age, MD, PhD [facility]

## 2018-03-28 NOTE — Addendum Note (Signed)
Addended by: Rebecca Eaton on: 03/28/2018 01:03 PM   Modules accepted: Orders

## 2018-05-10 ENCOUNTER — Ambulatory Visit: Payer: BC Managed Care – PPO | Admitting: Psychology

## 2018-05-16 ENCOUNTER — Other Ambulatory Visit: Payer: Self-pay | Admitting: Obstetrics and Gynecology

## 2018-05-16 DIAGNOSIS — N6320 Unspecified lump in the left breast, unspecified quadrant: Secondary | ICD-10-CM

## 2018-05-17 ENCOUNTER — Ambulatory Visit: Payer: BC Managed Care – PPO | Admitting: Psychology

## 2018-05-21 ENCOUNTER — Ambulatory Visit
Admission: RE | Admit: 2018-05-21 | Discharge: 2018-05-21 | Disposition: A | Discharge status: Home / Self Care | Payer: BC Managed Care – PPO | Source: Ambulatory Visit | Attending: Obstetrics and Gynecology | Admitting: Obstetrics and Gynecology

## 2018-05-21 DIAGNOSIS — N6320 Unspecified lump in the left breast, unspecified quadrant: Secondary | ICD-10-CM

## 2018-07-11 ENCOUNTER — Encounter: Payer: Self-pay | Admitting: Family Medicine

## 2018-07-15 ENCOUNTER — Other Ambulatory Visit: Payer: Self-pay | Admitting: Family Medicine

## 2018-07-19 ENCOUNTER — Other Ambulatory Visit: Payer: Self-pay | Admitting: Family Medicine

## 2018-07-19 ENCOUNTER — Encounter: Payer: Self-pay | Admitting: Neurology

## 2018-07-19 MED ORDER — TRAZODONE HCL 100 MG PO TABS: 50.0000 mg | ORAL_TABLET | Freq: Every evening | ORAL | 2 refills | Status: DC | PRN

## 2018-09-12 ENCOUNTER — Telehealth: Payer: Self-pay

## 2018-09-12 NOTE — Telephone Encounter (Signed)
I left a detailed message at the pts cell number to return a call to the office to schedule a virtual appt with another provider to evaluate her foot and discuss the need for a tetanus shot as Dr Ethlyn Gallery is out of the office today.

## 2018-09-12 NOTE — Telephone Encounter (Signed)
I called the pt and she stated she has not checked her voicemail.  Appt scheduled for tomorrow at 8am with Dr Jerilee Hoh.

## 2018-09-12 NOTE — Telephone Encounter (Signed)
Copied from [place] [phone]. Topic: General - Other >> Sep 12, 2018 10:25 AM Rainey Pines A wrote: Patient stepped on nail today and would like a callback in regards to getting a tetanus shot.

## 2018-09-13 ENCOUNTER — Other Ambulatory Visit: Payer: Self-pay

## 2018-09-13 ENCOUNTER — Encounter: Payer: Self-pay | Admitting: Internal Medicine

## 2018-09-13 ENCOUNTER — Ambulatory Visit: Payer: BC Managed Care – PPO | Admitting: Internal Medicine

## 2018-09-13 VITALS — BP 120/80 | HR 78 | Temp 98.6°F | Wt 157.6 lb

## 2018-09-13 DIAGNOSIS — Z23 Encounter for immunization: Secondary | ICD-10-CM | POA: Diagnosis not present

## 2018-09-13 DIAGNOSIS — S99922A Unspecified injury of left foot, initial encounter: Secondary | ICD-10-CM

## 2018-09-13 NOTE — Progress Notes (Signed)
Acute Office Visit     CC/Reason for Visit: Stepped on nail  HPI: Stephanie Mcfarland is a 43 y.o. female who is coming in today for the above mentioned reasons. Yesterday, while walking in her flip flops, she stepped on a nail that went thru the sole of her foam flip flops an into the plantar surface of her left foot. No redness or swelling that she has noticed.   Past Medical/Surgical History: Past Medical History:  Diagnosis Date  . GERD (gastroesophageal reflux disease)   . Nonobstructive atherosclerosis of coronary artery 10/22/2017  . PAC (premature atrial contraction) 07/19/2017  . Palpitations 07/19/2017  . Seasonal allergies     Past Surgical History:  Procedure Laterality Date  . NO PAST SURGERIES      Social History:  reports that she has never smoked. She has never used smokeless tobacco. She reports current alcohol use. She reports that she does not use drugs.  Allergies: Allergies  Allergen Reactions  . Sulfa Antibiotics Itching and Rash    Rash, itching, bumps    Family History:  Family History  Problem Relation Age of Onset  . Hyperlipidemia Mother   . Diabetes Father   . Hypertension Father   . Heart attack Father   . Stroke Father   . Heart disease Father   . Heart disease Brother   . Hypertension Brother   . Heart attack Brother   . Heart disease Brother   . Diabetes Paternal Grandfather   . Stroke Brother   . Thyroid disease Brother      Current Outpatient Medications:  .  aspirin EC 81 MG tablet, Take 81 mg by mouth daily., Disp: , Rfl:  .  cetirizine (ZYRTEC) 10 MG tablet, Take 10 mg by mouth daily as needed for allergies., Disp: , Rfl:  .  DEXILANT 60 MG capsule, Take 60 mg by mouth as needed. , Disp: , Rfl: 1 .  methylPREDNISolone Acetate (DEPO-MEDROL IJ), Inject as directed., Disp: , Rfl:  .  mometasone (NASONEX) 50 MCG/ACT nasal spray, instill 2 sprays into each nostril once daily as needed for nasal congestion, Disp: 17 g, Rfl: 0 .   Multiple Vitamins-Minerals (MULTIVITAMIN PO), Take 1 tablet by mouth daily., Disp: , Rfl:  .  traZODone (DESYREL) 100 MG tablet, Take 0.5-1 tablets (50-100 mg total) by mouth at bedtime as needed for sleep., Disp: 30 tablet, Rfl: 2 .  rosuvastatin (CRESTOR) 20 MG tablet, Take 1 tablet (20 mg total) by mouth daily., Disp: 90 tablet, Rfl: 3  Review of Systems:  Constitutional: Denies fever, chills, diaphoresis, appetite change and fatigue.  HEENT: Denies photophobia, eye pain, redness, hearing loss, ear pain, congestion, sore throat, rhinorrhea, sneezing, mouth sores, trouble swallowing, neck pain, neck stiffness and tinnitus.   Respiratory: Denies SOB, DOE, cough, chest tightness,  and wheezing.   Cardiovascular: Denies chest pain, palpitations and leg swelling.  Gastrointestinal: Denies nausea, vomiting, abdominal pain, diarrhea, constipation, blood in stool and abdominal distention.  Genitourinary: Denies dysuria, urgency, frequency, hematuria, flank pain and difficulty urinating.  Endocrine: Denies: hot or cold intolerance, sweats, changes in hair or nails, polyuria, polydipsia. Musculoskeletal: Denies myalgias, back pain, joint swelling, arthralgias and gait problem.  Skin: Denies pallor, rash and wound.  Neurological: Denies dizziness, seizures, syncope, weakness, light-headedness, numbness and headaches.  Hematological: Denies adenopathy. Easy bruising, personal or family bleeding history  Psychiatric/Behavioral: Denies suicidal ideation, mood changes, confusion, nervousness, sleep disturbance and agitation    Physical Exam: Vitals:  09/13/18 0800  BP: 120/80  Pulse: 78  Temp: 98.6 F (37 C)  TempSrc: Oral  SpO2: 96%  Weight: 157 lb 9.6 oz (71.5 kg)    Body mass index is 25.83 kg/m.   Constitutional: NAD, calm, comfortable Eyes: PERRL, lids and conjunctivae normal ENMT: Mucous membranes are moist.  Musculoskeletal: no clubbing / cyanosis. No joint deformity upper and  lower extremities. Good ROM, no contractures. Normal muscle tone.  Skin: small puncture lesion on the plantar surface of her left foot Neurologic: CN 2-12 grossly intact. Sensation intact, DTR normal. Strength 5/5 in all 4.  Psychiatric: Normal judgment and insight. Alert and oriented x 3. Normal mood.    Impression and Plan:  Injury of left foot, initial encounter -Given mechanism of injury, will prophylax against tetanus. -Tdap given today. -Since wound is clean, no need for tetanus IG or antibiotics. -She will notify us immediately with any changes.    Patient Instructions  -Nice meeting you today!!  -Tetanus vaccine administered today.  -Let us know if your foot becomes red or swollen immediately.     Lelon Frohlich, MD [facility]

## 2018-09-13 NOTE — Patient Instructions (Signed)
-  Nice meeting you today!!  -Tetanus vaccine administered today.  -Let us know if your foot becomes red or swollen immediately.

## 2018-09-13 NOTE — Addendum Note (Signed)
Addended by: Westley Hummer B on: 09/13/2018 08:41 AM   Modules accepted: Orders

## 2018-12-22 ENCOUNTER — Other Ambulatory Visit: Payer: Self-pay | Admitting: Cardiovascular Disease

## 2019-02-11 ENCOUNTER — Other Ambulatory Visit: Payer: Self-pay

## 2019-02-11 ENCOUNTER — Ambulatory Visit: Payer: BC Managed Care – PPO | Admitting: Cardiovascular Disease

## 2019-02-11 ENCOUNTER — Encounter: Payer: Self-pay | Admitting: Cardiovascular Disease

## 2019-02-11 VITALS — BP 120/78 | HR 78 | Ht 65.0 in | Wt 154.8 lb

## 2019-02-11 DIAGNOSIS — I2510 Atherosclerotic heart disease of native coronary artery without angina pectoris: Secondary | ICD-10-CM

## 2019-02-11 DIAGNOSIS — I493 Ventricular premature depolarization: Secondary | ICD-10-CM

## 2019-02-11 DIAGNOSIS — Z5181 Encounter for therapeutic drug level monitoring: Secondary | ICD-10-CM

## 2019-02-11 LAB — CBC WITH DIFFERENTIAL/PLATELET
Basophils Absolute: 0 10*3/uL (ref 0.0–0.2)
Basos: 1 %
EOS (ABSOLUTE): 0.1 10*3/uL (ref 0.0–0.4)
Eos: 2 %
Hematocrit: 42 % (ref 34.0–46.6)
Hemoglobin: 13.9 g/dL (ref 11.1–15.9)
Immature Grans (Abs): 0 10*3/uL (ref 0.0–0.1)
Immature Granulocytes: 0 %
Lymphocytes Absolute: 1.4 10*3/uL (ref 0.7–3.1)
Lymphs: 38 %
MCH: 27.9 pg (ref 26.6–33.0)
MCHC: 33.1 g/dL (ref 31.5–35.7)
MCV: 84 fL (ref 79–97)
Monocytes Absolute: 0.3 10*3/uL (ref 0.1–0.9)
Monocytes: 7 %
Neutrophils Absolute: 1.9 10*3/uL (ref 1.4–7.0)
Neutrophils: 52 %
Platelets: 210 10*3/uL (ref 150–450)
RBC: 4.98 x10E6/uL (ref 3.77–5.28)
RDW: 12.6 % (ref 11.7–15.4)
WBC: 3.7 10*3/uL (ref 3.4–10.8)

## 2019-02-11 LAB — LIPID PANEL
Chol/HDL Ratio: 2.3 ratio (ref 0.0–4.4)
Cholesterol, Total: 144 mg/dL (ref 100–199)
HDL: 63 mg/dL (ref >39)
LDL Chol Calc (NIH): 71 mg/dL (ref 0–99)
Triglycerides: 45 mg/dL (ref 0–149)
VLDL Cholesterol Cal: 10 mg/dL (ref 5–40)

## 2019-02-11 LAB — COMPREHENSIVE METABOLIC PANEL
ALT: 12 IU/L (ref 0–32)
AST: 20 IU/L (ref 0–40)
Albumin/Globulin Ratio: 1.8 (ref 1.2–2.2)
Albumin: 4.6 g/dL (ref 3.8–4.8)
Alkaline Phosphatase: 67 IU/L (ref 39–117)
BUN/Creatinine Ratio: 14 (ref 9–23)
BUN: 11 mg/dL (ref 6–24)
Bilirubin Total: 0.4 mg/dL (ref 0.0–1.2)
CO2: 20 mmol/L (ref 20–29)
Calcium: 9.9 mg/dL (ref 8.7–10.2)
Chloride: 104 mmol/L (ref 96–106)
Creatinine, Ser: 0.8 mg/dL (ref 0.57–1.00)
GFR calc Af Amer: 104 mL/min/{1.73_m2} (ref >59)
GFR calc non Af Amer: 91 mL/min/{1.73_m2} (ref >59)
Globulin, Total: 2.5 g/dL (ref 1.5–4.5)
Glucose: 90 mg/dL (ref 65–99)
Potassium: 4.6 mmol/L (ref 3.5–5.2)
Sodium: 139 mmol/L (ref 134–144)
Total Protein: 7.1 g/dL (ref 6.0–8.5)

## 2019-02-11 LAB — MAGNESIUM: Magnesium: 2 mg/dL (ref 1.6–2.3)

## 2019-02-11 LAB — TSH: TSH: 2.16 u[IU]/mL (ref 0.450–4.500)

## 2019-02-11 MED ORDER — METOPROLOL SUCCINATE ER 25 MG PO TB24: ORAL_TABLET | ORAL | 0 refills | Status: DC

## 2019-02-11 NOTE — Patient Instructions (Signed)
Medication Instructions:  Your physician recommends that you continue on your current medications as directed. Please refer to the Current Medication list given to you today.  *If you need a refill on your cardiac medications before your next appointment, please call your pharmacy*  Lab Work: LP/CMET/MAGNESIUM/TSH TODAY  If you have labs (blood work) drawn today and your tests are completely normal, you will receive your results only by: Marland Kitchen MyChart Message (if you have MyChart) OR . A paper copy in the mail If you have any lab test that is abnormal or we need to change your treatment, we will call you to review the results.  Testing/Procedures: NONE  Follow-Up: At [facility], you and your health needs are our priority.  As part of our continuing mission to provide you with exceptional heart care, we have created designated Provider Care Teams.  These Care Teams include your primary Cardiologist (physician) and Advanced Practice Providers (APPs -  Physician Assistants and Nurse Practitioners) who all work together to provide you with the care you need, when you need it.  Your next appointment:   12 month(s) You will receive a reminder letter in the mail two months in advance. If you don't receive a letter, please call our office to schedule the follow-up appointment.   The format for your next appointment:   Either In Person or Virtual  Provider:   You may see DR [facility]  or one of the following Advanced Practice Providers on your designated Care Team:    Kerin Ransom, PA-C  [place], [place]  Coletta Memos, [place]

## 2019-02-11 NOTE — Progress Notes (Signed)
Cardiology Office Note   Date:  02/11/2019   ID:  Stephanie Mcfarland, DOB [birth year], MRN [id number]  PCP:  Caren Macadam, MD  Cardiologist:   Skeet Latch, MD   No chief complaint on file.    History of Present Illness: Stephanie Mcfarland is a 43 y.o. female with non-obstructive CAD, family history of premature CAD and strokes, PVCs, allergies, and GERD here for follow up.  She was initially seen 07/2017 for the evaluation of palpitations.  Stephanie Mcfarland saw Dr. Garwin Brothers for a routine exam.  At that time she was noted to have ectopy.  She reported that she had previously seen Dr. Einar Gip for palpitations.  In 2015 she wore a Holter monitor that showed occasional PVCs and was otherwise unremarkable.  She reported daily palpitations and was started on metoprolol.  Since that time she has had an improvement in her palpitations.  She reported atypical chest pain that occurred at rest.  Given her family history of premature CAD she was referred for coronary CT-a  CT-A that revealed mild proximal LAD, proximal LCx, and mid RCA disease.  Coronary calcium score that was 99th percentile on 09/2017.  She was started on aspirin and rosuvastatin.   Of note, she has a strong family history of cardiovascular disease.  Her father had his first MI in his 57s and had a stroke in his 64s.  Her brother had a stroke at age 68 and another brother had premature CAD requiring CABG.    Since her last appointment Stephanie Mcfarland has been doing well.  She continues to exercise by jogging to 1/2 miles every 2 or 3 days/week.  She also does weights.  She has no exertional chest pain or shortness of breath.  She does have more palpitations when exercising.  Her palpitations seem to occur randomly but also are associated with anxiety.  It is unclear at times whether the anxiety makes her palpitations or the palpitations make her have anxiety.  She takes metoprolol as needed and it does seem to help sometimes.  She takes it  approximately twice per week.  She has not noted any lower extremity edema, orthopnea, or PND.   Past Medical History:  Diagnosis Date  . GERD (gastroesophageal reflux disease)   . Nonobstructive atherosclerosis of coronary artery 10/22/2017  . PAC (premature atrial contraction) 07/19/2017  . Palpitations 07/19/2017  . Seasonal allergies     Past Surgical History:  Procedure Laterality Date  . NO PAST SURGERIES       Current Outpatient Medications  Medication Sig Dispense Refill  . aspirin EC 81 MG tablet Take 81 mg by mouth daily.    . cetirizine (ZYRTEC) 10 MG tablet Take 10 mg by mouth daily as needed for allergies.    . methylPREDNISolone Acetate (DEPO-MEDROL IJ) Inject as directed.    . metoprolol succinate (TOPROL-XL) 25 MG 24 hr tablet TAKE 1 TABLET(25 MG) BY MOUTH DAILY 90 tablet 0  . mometasone (NASONEX) 50 MCG/ACT nasal spray instill 2 sprays into each nostril once daily as needed for nasal congestion 17 g 0  . Multiple Vitamins-Minerals (MULTIVITAMIN PO) Take 1 tablet by mouth daily.    . rosuvastatin (CRESTOR) 20 MG tablet TAKE 1 TABLET(20 MG) BY MOUTH DAILY 90 tablet 0   No current facility-administered medications for this visit.     Allergies:   Sulfa antibiotics    Social History:  The patient  reports that she has never smoked. She has never  used smokeless tobacco. She reports current alcohol use. She reports that she does not use drugs.   Family History:  The patient's family history includes Diabetes in her father and paternal grandfather; Heart attack in her brother and father; Heart disease in her brother, brother, and father; Hyperlipidemia in her mother; Hypertension in her brother and father; Stroke in her brother and father; Thyroid disease in her brother.    ROS:  Please see the history of present illness.   Otherwise, review of systems are positive for none.   All other systems are reviewed and negative.    PHYSICAL EXAM: VS:  BP 120/78   Pulse 78    Ht 5\' 5"  (1.651 m)   Wt 154 lb 12.8 oz (70.2 kg)   SpO2 94%   BMI 25.76 kg/m  , BMI Body mass index is 25.76 kg/m. GENERAL:  Well appearing HEENT: Pupils equal round and reactive, fundi not visualized, oral mucosa unremarkable NECK:  No jugular venous distention, waveform within normal limits, carotid upstroke brisk and symmetric, no bruits LUNGS:  Clear to auscultation bilaterally HEART:  RRR.  PMI not displaced or sustained,S1 and S2 within normal limits, no S3, no S4, no clicks, no rubs, no murmurs ABD:  Flat, positive bowel sounds normal in frequency in pitch, no bruits, no rebound, no guarding, no midline pulsatile mass, no hepatomegaly, no splenomegaly EXT:  2 plus pulses throughout, no edema, no cyanosis no clubbing SKIN:  No rashes no nodules NEURO:  Cranial nerves II through XII grossly intact, motor grossly intact throughout PSYCH:  Cognitively intact, oriented to person place and time   EKG:  EKG is ordered today. The ekg ordered 07/19/17 demonstrates Sinus rhythm.  Rate 81 bpm. 02/11/2019: Sinus rhythm.   Rate 78 bmp.    48-hour Holter 06/2013:  16 PVCs noted.  2 PACs.  Coronary CT-A 10/09/17: IMPRESSION: 1. Extensive plaque for age and gender, but there appears to be no more than mild stenosis.  2. Coronary artery calcium score 469 Agatston units, placing the patient in the 99th percentile for age and gender. This suggests high risk for future cardiac events.  Recent Labs: No results found for requested labs within last 8760 hours.   05/08/2017: Sodium 140, potassium 4.1, BUN 10, creatinine 0.78 AST 16, ALT 10 Total cholesterol 188, triglycerides 60, HDL 71, LDL 105 TSH 1.23 WBC 4.7, hemoglobin 12.5, hematocrit 38.3, platelets 260  Lipid Panel    Component Value Date/Time   CHOL 132 12/07/2017 0852   TRIG 39 12/07/2017 0852   HDL 59 12/07/2017 0852   CHOLHDL 2.2 12/07/2017 0852   LDLCALC 65 12/07/2017 0852      Wt Readings from Last 3 Encounters:   02/11/19 154 lb 12.8 oz (70.2 kg)  09/13/18 157 lb 9.6 oz (71.5 kg)  02/11/18 155 lb (70.3 kg)      ASSESSMENT AND PLAN:  # PACs: # PVCs:  Metoprolol helps.  It is unclear how much it is associated with anxiety.  She will try taking it daily.  If she still feels anxious once the palpitations are controlled she will talk to her PCP regarding anxiety.  Check CMP, CBC, TSH, magnesium.  # Non-obstructive CAD: # Family history of premature CVD:  # Hyperlipidemia:  Coronary calcium score is in the 99th percentile.  Continue aspirin and statin.  LDL goal <70.  Check lipids/CMP today.   Current medicines are reviewed at length with the patient today.  The patient does not have concerns  regarding medicines.  The following changes have been made: none  Labs/ tests ordered today include:   Orders Placed This Encounter  Procedures  . Lipid Profile  . Comprehensive Metabolic Panel (CMET)  . CBC w/Diff  . TSH  . Magnesium  . EKG 12-Lead     Disposition:   FU with Stephanie Armato C. Oval Linsey, MD, [facility] in 1 year.    Signed, Curry Seefeldt C. Oval Linsey, MD, [facility]  02/11/2019 8:26 AM    [facility]

## 2019-04-04 ENCOUNTER — Other Ambulatory Visit: Payer: Self-pay | Admitting: Cardiovascular Disease

## 2019-05-30 ENCOUNTER — Ambulatory Visit: Payer: BC Managed Care – PPO

## 2019-07-31 ENCOUNTER — Other Ambulatory Visit: Payer: Self-pay | Admitting: Cardiovascular Disease

## 2019-08-08 ENCOUNTER — Other Ambulatory Visit: Payer: Self-pay | Admitting: Cardiovascular Disease

## 2019-11-28 ENCOUNTER — Other Ambulatory Visit: Payer: Self-pay | Admitting: Cardiovascular Disease

## 2020-01-15 ENCOUNTER — Ambulatory Visit: Payer: BC Managed Care – PPO | Attending: Internal Medicine

## 2020-01-15 DIAGNOSIS — Z23 Encounter for immunization: Secondary | ICD-10-CM

## 2020-01-15 NOTE — Progress Notes (Signed)
   Covid-19 Vaccination Clinic  Name:  CHRISTEL BAI    MRN: [id number] DOB: Jul 23, 1975  01/15/2020  Ms. Mclear was observed post Covid-19 immunization for 15 minutes without incident. She was provided with Vaccine Information Sheet and instruction to access the V-Safe system.   Ms. Chanda was instructed to call 911 with any severe reactions post vaccine: Marland Kitchen Difficulty breathing  . Swelling of face and throat  . A fast heartbeat  . A bad rash all over body  . Dizziness and weakness

## 2020-02-18 ENCOUNTER — Ambulatory Visit: Payer: BC Managed Care – PPO | Admitting: Cardiovascular Disease

## 2020-02-18 ENCOUNTER — Other Ambulatory Visit: Payer: Self-pay

## 2020-02-18 ENCOUNTER — Encounter: Payer: Self-pay | Admitting: Cardiovascular Disease

## 2020-02-18 VITALS — BP 128/80 | HR 67 | Ht 65.5 in | Wt 160.0 lb

## 2020-02-18 DIAGNOSIS — R002 Palpitations: Secondary | ICD-10-CM

## 2020-02-18 DIAGNOSIS — I491 Atrial premature depolarization: Secondary | ICD-10-CM

## 2020-02-18 DIAGNOSIS — I2510 Atherosclerotic heart disease of native coronary artery without angina pectoris: Secondary | ICD-10-CM | POA: Diagnosis not present

## 2020-02-18 DIAGNOSIS — Z13220 Encounter for screening for lipoid disorders: Secondary | ICD-10-CM | POA: Diagnosis not present

## 2020-02-18 MED ORDER — METOPROLOL SUCCINATE ER 25 MG PO TB24: 25.0000 mg | ORAL_TABLET | Freq: Every day | ORAL | 3 refills | Status: DC

## 2020-02-18 MED ORDER — ROSUVASTATIN CALCIUM 20 MG PO TABS: 20.0000 mg | ORAL_TABLET | Freq: Every day | ORAL | 3 refills | Status: DC

## 2020-02-18 NOTE — Progress Notes (Signed)
Cardiology Office Note   Date:  02/18/2020   ID:  Stephanie Mcfarland, DOB 1976/01/17, MRN [id number]  PCP:  Stephanie Macadam, MD  Cardiologist:   Stephanie Latch, MD   No chief complaint on file.    History of Present Illness: Stephanie Mcfarland is a 44 y.o. female with non-obstructive CAD, family history of premature CAD and strokes, PVCs, allergies, and GERD here for follow up.  She was initially seen 07/2017 for the evaluation of palpitations.  Ms. Stephanie Mcfarland saw Dr. Garwin Brothers for a routine exam.  At that time she was noted to have ectopy.  She reported that she had previously seen Dr. Einar Mcfarland for palpitations.  In 2015 she wore a Holter monitor that showed occasional PVCs and was otherwise unremarkable.  She reported daily palpitations and was started on metoprolol.  Since that time she has had an improvement in her palpitations.  She reported atypical chest pain that occurred at rest.  Given her family history of premature CAD she was referred for coronary CT-A that revealed mild proximal LAD, proximal LCx, and mid RCA disease.  Coronary calcium score was 469 (99th percentile) on 09/2017.  She was started on aspirin and rosuvastatin.   Of note, she has a strong family history of cardiovascular disease.  Her father had his first MI in his 27s and had a stroke in his 11s.  Her brother had a stroke at age 2 and another brother had premature CAD requiring CABG.    Since her last appointment Ms. Geraghty has been doing well.  She continues to struggle with frequent palpitations at times.  She notices them at rest and sometimes with exertion.  There is no associated chest pain and her breathing is stable.  Episodes last for a few seconds at a time.  Feels like her heart is starting and stopping.  She had an episode when raking leaves last week.  She has been feeling otherwise well and has no associated anxiety.  She has very very rare limited caffeine intake.  She has not had any lightheadedness, dizziness,  lower extremity edema, orthopnea, or PND.  Overall her diet has been very good.  She mostly cooks at home.  She continues to exercise by running several days per week and feels okay with exertion.  She notes that she has been feeling less stress lately and is sleeping well.   Past Medical History:  Diagnosis Date  . GERD (gastroesophageal reflux disease)   . Nonobstructive atherosclerosis of coronary artery 10/22/2017  . PAC (premature atrial contraction) 07/19/2017  . Palpitations 07/19/2017  . Seasonal allergies     Past Surgical History:  Procedure Laterality Date  . NO PAST SURGERIES       Current Outpatient Medications  Medication Sig Dispense Refill  . aspirin EC 81 MG tablet Take 81 mg by mouth daily.    . cetirizine (ZYRTEC) 10 MG tablet Take 10 mg by mouth daily as needed for allergies.    . methylPREDNISolone Acetate (DEPO-MEDROL IJ) Inject as directed.    . metoprolol succinate (TOPROL-XL) 25 MG 24 hr tablet Take 1 tablet (25 mg total) by mouth daily. 90 tablet 3  . mometasone (NASONEX) 50 MCG/ACT nasal spray instill 2 sprays into each nostril once daily as needed for nasal congestion 17 g 0  . Multiple Vitamins-Minerals (MULTIVITAMIN PO) Take 1 tablet by mouth daily.    . rosuvastatin (CRESTOR) 20 MG tablet Take 1 tablet (20 mg total) by mouth daily.  90 tablet 3   No current facility-administered medications for this visit.    Allergies:   Sulfa antibiotics    Social History:  The patient  reports that she has never smoked. She has never used smokeless tobacco. She reports current alcohol use. She reports that she does not use drugs.   Family History:  The patient's family history includes Diabetes in her father and paternal grandfather; Heart attack in her brother and father; Heart disease in her brother, brother, and father; Hyperlipidemia in her mother; Hypertension in her brother and father; Stroke in her brother and father; Thyroid disease in her brother.    ROS:   Please see the history of present illness.   Otherwise, review of systems are positive for none.   All other systems are reviewed and negative.    PHYSICAL EXAM: VS:  BP 128/80   Pulse 67   Ht 5' 5.5" (1.664 m)   Wt 160 lb (72.6 kg)   SpO2 93%   BMI 26.22 kg/m  , BMI Body mass index is 26.22 kg/m. GENERAL:  Well appearing HEENT: Pupils equal round and reactive, fundi not visualized, oral mucosa unremarkable NECK:  No jugular venous distention, waveform within normal limits, carotid upstroke brisk and symmetric, no bruits LUNGS:  Clear to auscultation bilaterally HEART:  RRR.  PMI not displaced or sustained,S1 and S2 within normal limits, no S3, no S4, no clicks, no rubs, no murmurs ABD:  Flat, positive bowel sounds normal in frequency in pitch, no bruits, no rebound, no guarding, no midline pulsatile mass, no hepatomegaly, no splenomegaly EXT:  2 plus pulses throughout, no edema, no cyanosis no clubbing SKIN:  No rashes no nodules NEURO:  Cranial nerves II through XII grossly intact, motor grossly intact throughout PSYCH:  Cognitively intact, oriented to person place and time   EKG:  EKG is ordered today. The ekg ordered 07/19/17 demonstrates Sinus rhythm.  Rate 81 bpm. 02/11/2019: Sinus rhythm.   Rate 78 bmp.   02/18/2020: Sinus rhythm.  Rate 67 bpm.  Low voltage  48-hour Holter 06/2013:  16 PVCs noted.  2 PACs.  Coronary CT-A 10/09/17: IMPRESSION: 1. Extensive plaque for age and gender, but there appears to be no more than mild stenosis.  2. Coronary artery calcium score 469 Agatston units, placing the patient in the 99th percentile for age and gender. This suggests high risk for future cardiac events.  Recent Labs: No results found for requested labs within last 8760 hours.   05/08/2017: Sodium 140, potassium 4.1, BUN 10, creatinine 0.78 AST 16, ALT 10 Total cholesterol 188, triglycerides 60, HDL 71, LDL 105 TSH 1.23 WBC 4.7, hemoglobin 12.5, hematocrit 38.3, platelets  260  Lipid Panel    Component Value Date/Time   CHOL 144 02/11/2019 0838   TRIG 45 02/11/2019 0838   HDL 63 02/11/2019 0838   CHOLHDL 2.3 02/11/2019 0838   LDLCALC 71 02/11/2019 0838      Wt Readings from Last 3 Encounters:  02/18/20 160 lb (72.6 kg)  02/11/19 154 lb 12.8 oz (70.2 kg)  09/13/18 157 lb 9.6 oz (71.5 kg)      ASSESSMENT AND PLAN:  # PACs: # PVCs:  Ms. Cales continues to have frequent palpitations.  It has been better since starting metoprolol.  We discussed increasing the dose and she prefers to stay on her current dose for now.  She can take an extra 12.5 mg as needed.  Check TSH, CMP, magnesium, and CBC.  # Non-obstructive CAD: #  Family history of premature CVD:  # Hyperlipidemia:  Coronary calcium score is in the 99th percentile.  Continue aspirin and statin.  LDL goal <70.  Check lipids/CMP today.   Current medicines are reviewed at length with the patient today.  The patient does not have concerns regarding medicines.  The following changes have been made: none  Labs/ tests ordered today include:   Orders Placed This Encounter  Procedures  . CBC with Differential/Platelet  . TSH  . T4, free  . Lipid panel  . Magnesium  . Comprehensive metabolic panel  . EKG 12-Lead     Disposition:   FU with Adriano Bischof C. Oval Linsey, MD, [facility] in 1 year.    Signed, Stryker Veasey C. Oval Linsey, MD, [facility]  02/18/2020 4:53 PM    [facility]

## 2020-02-18 NOTE — Patient Instructions (Addendum)
Medication Instructions:  Your physician recommends that you continue on your current medications as directed. Please refer to the Current Medication list given to you today.  *If you need a refill on your cardiac medications before your next appointment, please call your pharmacy*  Lab Work: LP/CMET/TSH/FT4/MAGNESIUM/CBC TODAY   If you have labs (blood work) drawn today and your tests are completely normal, you will receive your results only by: Marland Kitchen MyChart Message (if you have MyChart) OR . A paper copy in the mail If you have any lab test that is abnormal or we need to change your treatment, we will call you to review the results.  Testing/Procedures: NONE   Follow-Up: At [facility], you and your health needs are our priority.  As part of our continuing mission to provide you with exceptional heart care, we have created designated Provider Care Teams.  These Care Teams include your primary Cardiologist (physician) and Advanced Practice Providers (APPs -  Physician Assistants and Nurse Practitioners) who all work together to provide you with the care you need, when you need it.  We recommend signing up for the patient portal called "MyChart".  Sign up information is provided on this After Visit Summary.  MyChart is used to connect with patients for Virtual Visits (Telemedicine).  Patients are able to view lab/test results, encounter notes, upcoming appointments, etc.  Non-urgent messages can be sent to your provider as well.   To learn more about what you can do with MyChart, go to [email or web address].    Your next appointment:   12 month(s)  You will receive a reminder letter in the mail two months in advance. If you don't receive a letter, please call our office to schedule the follow-up appointment.   The format for your next appointment:   In Person  Provider:   You may see DR [facility]  or one of the following Advanced Practice Providers on your designated Care Team:     Kerin Ransom, PA-C  [place], [place]  Coletta Memos, [place]

## 2020-02-19 LAB — LIPID PANEL
Chol/HDL Ratio: 2.1 ratio (ref 0.0–4.4)
Cholesterol, Total: 141 mg/dL (ref 100–199)
HDL: 66 mg/dL (ref >39)
LDL Chol Calc (NIH): 65 mg/dL (ref 0–99)
Triglycerides: 43 mg/dL (ref 0–149)
VLDL Cholesterol Cal: 10 mg/dL (ref 5–40)

## 2020-02-19 LAB — COMPREHENSIVE METABOLIC PANEL
ALT: 13 IU/L (ref 0–32)
AST: 20 IU/L (ref 0–40)
Albumin/Globulin Ratio: 1.9 (ref 1.2–2.2)
Albumin: 4.6 g/dL (ref 3.8–4.8)
Alkaline Phosphatase: 62 IU/L (ref 44–121)
BUN/Creatinine Ratio: 12 (ref 9–23)
BUN: 9 mg/dL (ref 6–24)
Bilirubin Total: 0.6 mg/dL (ref 0.0–1.2)
CO2: 22 mmol/L (ref 20–29)
Calcium: 9.6 mg/dL (ref 8.7–10.2)
Chloride: 103 mmol/L (ref 96–106)
Creatinine, Ser: 0.74 mg/dL (ref 0.57–1.00)
GFR calc Af Amer: 114 mL/min/{1.73_m2} (ref >59)
GFR calc non Af Amer: 99 mL/min/{1.73_m2} (ref >59)
Globulin, Total: 2.4 g/dL (ref 1.5–4.5)
Glucose: 79 mg/dL (ref 65–99)
Potassium: 4.2 mmol/L (ref 3.5–5.2)
Sodium: 138 mmol/L (ref 134–144)
Total Protein: 7 g/dL (ref 6.0–8.5)

## 2020-02-19 LAB — CBC WITH DIFFERENTIAL/PLATELET
Basophils Absolute: 0 10*3/uL (ref 0.0–0.2)
Basos: 1 %
EOS (ABSOLUTE): 0.1 10*3/uL (ref 0.0–0.4)
Eos: 2 %
Hematocrit: 40.7 % (ref 34.0–46.6)
Hemoglobin: 13.3 g/dL (ref 11.1–15.9)
Immature Grans (Abs): 0 10*3/uL (ref 0.0–0.1)
Immature Granulocytes: 0 %
Lymphocytes Absolute: 1.9 10*3/uL (ref 0.7–3.1)
Lymphs: 43 %
MCH: 27.7 pg (ref 26.6–33.0)
MCHC: 32.7 g/dL (ref 31.5–35.7)
MCV: 85 fL (ref 79–97)
Monocytes Absolute: 0.3 10*3/uL (ref 0.1–0.9)
Monocytes: 7 %
Neutrophils Absolute: 2 10*3/uL (ref 1.4–7.0)
Neutrophils: 47 %
Platelets: 218 10*3/uL (ref 150–450)
RBC: 4.8 x10E6/uL (ref 3.77–5.28)
RDW: 12.4 % (ref 11.7–15.4)
WBC: 4.4 10*3/uL (ref 3.4–10.8)

## 2020-02-19 LAB — TSH: TSH: 2.06 u[IU]/mL (ref 0.450–4.500)

## 2020-02-19 LAB — MAGNESIUM: Magnesium: 2 mg/dL (ref 1.6–2.3)

## 2020-02-19 LAB — T4, FREE: Free T4: 1.15 ng/dL (ref 0.82–1.77)

## 2020-03-08 ENCOUNTER — Other Ambulatory Visit: Payer: Self-pay | Admitting: Cardiovascular Disease

## 2020-04-13 ENCOUNTER — Other Ambulatory Visit: Payer: Self-pay | Admitting: Podiatry

## 2020-04-13 ENCOUNTER — Ambulatory Visit: Payer: BC Managed Care – PPO | Admitting: Podiatry

## 2020-04-13 ENCOUNTER — Other Ambulatory Visit: Payer: Self-pay

## 2020-04-13 ENCOUNTER — Ambulatory Visit (INDEPENDENT_AMBULATORY_CARE_PROVIDER_SITE_OTHER): Payer: BC Managed Care – PPO

## 2020-04-13 DIAGNOSIS — M2042 Other hammer toe(s) (acquired), left foot: Secondary | ICD-10-CM

## 2020-04-13 DIAGNOSIS — M205X2 Other deformities of toe(s) (acquired), left foot: Secondary | ICD-10-CM

## 2020-04-13 DIAGNOSIS — L84 Corns and callosities: Secondary | ICD-10-CM

## 2020-04-13 NOTE — Progress Notes (Signed)
  Subjective:  Patient ID: Stephanie Mcfarland, female    DOB: 08-20-1975,  MRN: [id number]  Chief Complaint  Patient presents with  . corn    Lt 5th toe lesion x 3 mo; 4/10 sorness -pt denies redness/swelling -worse with shoes Tx: corn cushion, medicated liquid     45 y.o. female presents with the above complaint. History confirmed with patient.   Objective:  Physical Exam: warm, good capillary refill, no trophic changes or ulcerative lesions, normal DP and PT pulses and normal sensory exam. Left Foot: left 5th toe PIPJ corn, adductovarus toe, 3rd/4th mild hammertoes.   No images are attached to the encounter.  Radiographs: X-ray of the left foot: no fracture, dislocation, swelling or degenerative changes noted and digital contractures Assessment:   1. Hammertoe of left foot   2. Acquired adductovarus rotation of toe, left    Plan:  Patient was evaluated and treated and all questions answered.  Hammertoe -XR reviewed with patient -Educated on etiology of deformity -Discussed padding and shoe gear changes -Discussed with patient that they would benefit from surgical intervention after having failed all conservative therapy.  Return in about 6 weeks (around 05/25/2020) for Hammertoe f/u.

## 2020-05-25 ENCOUNTER — Ambulatory Visit: Payer: BC Managed Care – PPO | Admitting: Podiatry

## 2020-06-11 ENCOUNTER — Other Ambulatory Visit: Payer: Self-pay

## 2020-06-11 ENCOUNTER — Ambulatory Visit: Payer: BC Managed Care – PPO | Admitting: Podiatry

## 2020-06-11 DIAGNOSIS — M2042 Other hammer toe(s) (acquired), left foot: Secondary | ICD-10-CM | POA: Diagnosis not present

## 2020-06-11 DIAGNOSIS — M205X2 Other deformities of toe(s) (acquired), left foot: Secondary | ICD-10-CM

## 2020-06-11 DIAGNOSIS — M2062 Acquired deformities of toe(s), unspecified, left foot: Secondary | ICD-10-CM

## 2020-06-11 NOTE — Progress Notes (Signed)
  Subjective:  Patient ID: Stephanie Mcfarland, female    DOB: 08-05-1975,  MRN: [id number]  Chief Complaint  Patient presents with  . Hammer Toe    6 week follow up hammer toe of left foot. Pt states pain was improving then pain recently came back.    45 y.o. female presents with the above complaint. History confirmed with patient.   Objective:  Physical Exam: warm, good capillary refill, no trophic changes or ulcerative lesions, normal DP and PT pulses and normal sensory exam. Left Foot: left 5th toe PIPJ corn, adductovarus toe, 3rd/4th mild hammertoes.   No images are attached to the encounter.  Radiographs: X-ray of the left foot: no fracture, dislocation, swelling or degenerative changes noted and digital contractures Assessment:   1. Hammertoe of left foot   2. Acquired adductovarus rotation of toe, left   3. Acquired deformity of left toe    Plan:  Patient was evaluated and treated and all questions answered.  Hammertoe -Patient has failed all conservative therapy and wishes to proceed with surgical intervention. All risks, benefits, and alternatives discussed with patient. No guarantees given. Consent reviewed and signed by patient. -Planned procedures: left 5th toe hammertoe correction -ASA 2 - Patient with mild systemic disease with no functional limitations; Risk factors: PAC -Post-op anticoagulation: chemoprophylaxis not indicated -DME dispensed for post-op use: Surgical Shoe   Return for Post-op Care.

## 2020-07-19 ENCOUNTER — Encounter: Payer: Self-pay | Admitting: Podiatry

## 2020-07-20 DIAGNOSIS — M79676 Pain in unspecified toe(s): Secondary | ICD-10-CM

## 2020-07-29 ENCOUNTER — Telehealth: Payer: Self-pay | Admitting: Urology

## 2020-07-29 NOTE — Telephone Encounter (Signed)
DOS - 08/18/20  HAMMERTOE REPAIR 5TH LEFT --- [id number]   BCBS EFFECTIVE DATE - 03/20/20  PLAN DEDUCTIBLE - $1,250.00 W/ $1,250.00 REMAINING OUT OF POCKET - $4,890.00 W/ $4,695.00 REMAINING COINSURANCE - 20% COPAY - $0.00   NO PRIOR AUTH REQUIRED

## 2020-08-18 ENCOUNTER — Encounter: Payer: Self-pay | Admitting: Podiatry

## 2020-08-18 ENCOUNTER — Other Ambulatory Visit: Payer: Self-pay | Admitting: Podiatry

## 2020-08-18 DIAGNOSIS — M2042 Other hammer toe(s) (acquired), left foot: Secondary | ICD-10-CM | POA: Diagnosis not present

## 2020-08-18 MED ORDER — OXYCODONE-ACETAMINOPHEN 5-325 MG PO TABS: 1.0000 | ORAL_TABLET | ORAL | 0 refills | Status: DC | PRN

## 2020-08-18 MED ORDER — CEPHALEXIN 500 MG PO CAPS: ORAL_CAPSULE | ORAL | 0 refills | Status: DC

## 2020-08-18 NOTE — Progress Notes (Signed)
Rx sent to pharmacy for outpatient surgery. [facility] [address] Linna Hoff

## 2020-08-24 ENCOUNTER — Ambulatory Visit (INDEPENDENT_AMBULATORY_CARE_PROVIDER_SITE_OTHER): Payer: BC Managed Care – PPO | Admitting: Podiatry

## 2020-08-24 ENCOUNTER — Ambulatory Visit (INDEPENDENT_AMBULATORY_CARE_PROVIDER_SITE_OTHER): Payer: BC Managed Care – PPO

## 2020-08-24 ENCOUNTER — Encounter: Payer: Self-pay | Admitting: Podiatry

## 2020-08-24 ENCOUNTER — Other Ambulatory Visit: Payer: Self-pay

## 2020-08-24 DIAGNOSIS — R1032 Left lower quadrant pain: Secondary | ICD-10-CM | POA: Insufficient documentation

## 2020-08-24 DIAGNOSIS — R142 Eructation: Secondary | ICD-10-CM | POA: Insufficient documentation

## 2020-08-24 DIAGNOSIS — M2042 Other hammer toe(s) (acquired), left foot: Secondary | ICD-10-CM

## 2020-08-24 DIAGNOSIS — R634 Abnormal weight loss: Secondary | ICD-10-CM | POA: Insufficient documentation

## 2020-08-24 DIAGNOSIS — R1111 Vomiting without nausea: Secondary | ICD-10-CM | POA: Insufficient documentation

## 2020-08-24 DIAGNOSIS — R141 Gas pain: Secondary | ICD-10-CM | POA: Insufficient documentation

## 2020-08-24 DIAGNOSIS — R140 Abdominal distension (gaseous): Secondary | ICD-10-CM | POA: Insufficient documentation

## 2020-08-24 DIAGNOSIS — R1013 Epigastric pain: Secondary | ICD-10-CM | POA: Insufficient documentation

## 2020-08-24 DIAGNOSIS — K219 Gastro-esophageal reflux disease without esophagitis: Secondary | ICD-10-CM | POA: Insufficient documentation

## 2020-08-26 DIAGNOSIS — M79676 Pain in unspecified toe(s): Secondary | ICD-10-CM

## 2020-08-31 ENCOUNTER — Encounter: Payer: BC Managed Care – PPO | Admitting: Podiatry

## 2020-09-03 ENCOUNTER — Encounter: Payer: BC Managed Care – PPO | Admitting: Podiatry

## 2020-09-07 ENCOUNTER — Ambulatory Visit (INDEPENDENT_AMBULATORY_CARE_PROVIDER_SITE_OTHER): Payer: BC Managed Care – PPO | Admitting: Podiatry

## 2020-09-07 ENCOUNTER — Other Ambulatory Visit: Payer: Self-pay

## 2020-09-07 DIAGNOSIS — Z9889 Other specified postprocedural states: Secondary | ICD-10-CM

## 2020-09-07 DIAGNOSIS — M2042 Other hammer toe(s) (acquired), left foot: Secondary | ICD-10-CM

## 2020-09-07 DIAGNOSIS — M205X2 Other deformities of toe(s) (acquired), left foot: Secondary | ICD-10-CM

## 2020-09-07 NOTE — Progress Notes (Signed)
  Subjective:  Patient ID: Stephanie Mcfarland, female    DOB: June 11, 1975,  MRN: [id number]  Chief Complaint  Patient presents with   Routine Post Op    POV#2 DOS 08/18/2020 LEFT 5TH HAMMERTOE CORRECTION Reports tenderness, feeling trying to come back, walking well. Denies f/c/n/v.     DOS: 08/18/20 Procedure: Hammertoe Correction 5th toe left  45 y.o. female presents with the above complaint. History confirmed with patient.   Objective:  Physical Exam: tenderness at the surgical site, local edema noted, and calf supple, nontender. Incision: healing well, no significant drainage, no dehiscence, no significant erythema  Assessment:   1. Hammertoe of left foot   2. Acquired adductovarus rotation of toe, left   3. Post-operative state     Plan:  Patient was evaluated and treated and all questions answered.  Post-operative State -Sutures removed -Ok to start showering at this time. Advised they cannot soak. -Dress daily with abx ointment -WBAT in Surgical shoe -XRs needed at follow-up: none   Return in about 2 weeks (around 09/21/2020).

## 2020-09-08 ENCOUNTER — Encounter: Payer: Self-pay | Admitting: Podiatry

## 2020-09-09 NOTE — Progress Notes (Signed)
  Subjective:  Patient ID: Stephanie Mcfarland, female    DOB: 29-May-1975,  MRN: [id number]  Chief Complaint  Patient presents with   Routine Post Op    POV#1 DOS 08/18/2020 LEFT 5TH HAMMERTOE CORRECTION Manageable pain lvls, Denies f/c/n/v. Icing/elevating as advised.     DOS: 08/18/20 Procedure: Hammertoe Correction 5th toe left  45 y.o. female presents with the above complaint. History confirmed with patient.   Objective:  Physical Exam: tenderness at the surgical site, local edema noted, and calf supple, nontender. Incision: healing well, no significant drainage, no dehiscence, no significant erythema  No images are attached to the encounter.  Radiographs: X-ray of the left foot: Arthroplasty left fifth toe noted without acute complication  Assessment:   1. Hammertoe of left foot     Plan:  Patient was evaluated and treated and all questions answered.  Post-operative State -XR reviewed with patient -Ok to start showering at this time. Advised they cannot soak. -Dressing applied consisting of povidone and band-aid -WBAT in Surgical shoe -XRs needed at follow-up: none   No follow-ups on file.

## 2020-09-11 ENCOUNTER — Encounter: Payer: BC Managed Care – PPO | Admitting: Podiatry

## 2020-09-21 ENCOUNTER — Encounter: Payer: Self-pay | Admitting: Podiatry

## 2020-09-21 ENCOUNTER — Other Ambulatory Visit: Payer: Self-pay

## 2020-09-21 ENCOUNTER — Ambulatory Visit (INDEPENDENT_AMBULATORY_CARE_PROVIDER_SITE_OTHER): Payer: BC Managed Care – PPO | Admitting: Podiatry

## 2020-09-21 DIAGNOSIS — Z9889 Other specified postprocedural states: Secondary | ICD-10-CM

## 2020-09-21 DIAGNOSIS — M2042 Other hammer toe(s) (acquired), left foot: Secondary | ICD-10-CM

## 2020-09-21 DIAGNOSIS — M205X2 Other deformities of toe(s) (acquired), left foot: Secondary | ICD-10-CM

## 2020-09-21 NOTE — Progress Notes (Signed)
  Subjective:  Patient ID: Stephanie Mcfarland, female    DOB: 04/13/1975,  MRN: [id number]  Chief Complaint  Patient presents with   Routine Post Op    POV#3 DOS 08/18/2020 LEFT 5TH HAMMERTOE CORRECTION  Few questions about when feelin will get back to normal and when can go back to normal activities? Denies f/c/n/v. Manageable pain lvls.     DOS: 08/18/20 Procedure: Hammertoe Correction 5th toe left  45 y.o. female presents with the above complaint. History confirmed with patient. Doing very well not having any pain, has been walking in her sandal without pain. Sensation returning slowly.  Objective:  Physical Exam: mild tenderness at the surgical site, local edema noted, and calf supple, nontender. Incision: Full healed. Assessment:   1. Hammertoe of left foot   2. Acquired adductovarus rotation of toe, left   3. Post-operative state    Plan:  Patient was evaluated and treated and all questions answered.  Post-operative State -Healing well. Currently in [place] sandal without pain.  -Transition to normal shoegear in 2 weeks.  -In 2 weeks can return to normal activity as tolerated.  No follow-ups on file.

## 2020-10-12 ENCOUNTER — Ambulatory Visit (INDEPENDENT_AMBULATORY_CARE_PROVIDER_SITE_OTHER): Payer: BC Managed Care – PPO | Admitting: Podiatry

## 2020-10-12 ENCOUNTER — Other Ambulatory Visit: Payer: Self-pay

## 2020-10-12 DIAGNOSIS — Z9889 Other specified postprocedural states: Secondary | ICD-10-CM

## 2020-10-12 DIAGNOSIS — M2042 Other hammer toe(s) (acquired), left foot: Secondary | ICD-10-CM

## 2020-10-15 NOTE — Progress Notes (Signed)
  Subjective:  Patient ID: Stephanie Mcfarland, female    DOB: 27-Jan-1976,  MRN: [id number]  Chief Complaint  Patient presents with   Routine Post Op    Patient states she is able to wear a supportive shoe without difficulties but the area still feels "weird". Patient states it is hard to explain but it just feels different. Patient denies any concerns at this time.     DOS: 08/18/20 Procedure: Hammertoe Correction 5th toe left  45 y.o. female presents with the above complaint. History confirmed with patient.   Objective:  Physical Exam: no tenderness at the surgical site, local edema noted, and calf supple, nontender. Incision: Full healed. Assessment:   1. Hammertoe of left foot   2. Post-operative state     Plan:  Patient was evaluated and treated and all questions answered.  Post-operative State -Doing well post-operatively. Discussed that skin maturation and nerve regeneration will take time to occur. At this point she can continue normal shoegear and f/u in 6 weeks for recheck.  No follow-ups on file.

## 2020-12-17 ENCOUNTER — Other Ambulatory Visit: Payer: Self-pay

## 2020-12-17 ENCOUNTER — Ambulatory Visit (INDEPENDENT_AMBULATORY_CARE_PROVIDER_SITE_OTHER): Payer: BC Managed Care – PPO | Admitting: Podiatry

## 2020-12-17 DIAGNOSIS — M205X2 Other deformities of toe(s) (acquired), left foot: Secondary | ICD-10-CM | POA: Diagnosis not present

## 2020-12-17 DIAGNOSIS — M2042 Other hammer toe(s) (acquired), left foot: Secondary | ICD-10-CM

## 2020-12-24 NOTE — Progress Notes (Signed)
  Subjective:  Patient ID: Gardner Candle, female    DOB: [birth year],  MRN: [id number]  Chief Complaint  Patient presents with   Routine Post Op    POV DOS 6.1.2022 Pt states odd nerve sensation with touch and has pain in shoes.    DOS: 08/18/20 Procedure: Hammertoe Correction 5th toe left  45 y.o. female presents with the above complaint. History confirmed with patient.   Objective:  Physical Exam: no tenderness at the surgical site, local edema noted, and calf supple, nontender. Incision: Full healed. Assessment:   1. Hammertoe of left foot   2. Acquired adductovarus rotation of toe, left      Plan:  Patient was evaluated and treated and all questions answered.  Post-operative State -She is doing rather well she is does have a little bit of continued altered sensation which we discussed will return with time.  Has not tried all different shoes and we discussed that this will improve with time gets more used to shoe gear.  The deformity appears well corrected and she does not have any prominence so I think she should make a full recovery.  We discussed continued normal shoe gear and follow-up as needed should pain persist  No follow-ups on file.

## 2021-02-08 ENCOUNTER — Encounter: Payer: Self-pay | Admitting: Family Medicine

## 2021-02-08 ENCOUNTER — Telehealth (INDEPENDENT_AMBULATORY_CARE_PROVIDER_SITE_OTHER): Payer: BC Managed Care – PPO | Admitting: Family Medicine

## 2021-02-08 VITALS — Temp 99.4°F

## 2021-02-08 DIAGNOSIS — U071 COVID-19: Secondary | ICD-10-CM

## 2021-02-08 MED ORDER — MOLNUPIRAVIR EUA 200MG CAPSULE: 4.0000 | ORAL_CAPSULE | Freq: Two times a day (BID) | ORAL | 0 refills | Status: AC

## 2021-02-08 MED ORDER — BENZONATATE 100 MG PO CAPS: ORAL_CAPSULE | ORAL | 0 refills | Status: DC

## 2021-02-08 NOTE — Patient Instructions (Addendum)
---------------------------------------------------------------------------------------------------------------------------    WORK SLIP:  Patient Stephanie Mcfarland,  23-Dec-1975, was seen for a medical visit today, 02/08/21 . Please excuse from work for a COVID/flu like illness. If Covid19 testing is positive advise 10 days minimum from the onset of symptoms (02/04/21) PLUS 1 day of no fever and improved symptoms. Will defer to employer for a sooner return to work if patient has 2 negative covid tests 48 hours apart and is feeling better, or if symptoms have resolved, it is greater than 5 days since the positive test and the patient can wear a high-quality, tight fitting mask such as N95 or KN95 at all times for an additional 5 days. Would also suggest COVID19 antigen testing is negative prior to early return from a Covid illness.  Sincerely: E-signature: Dr. Colin Benton, DO [facility] Ph: [phone]   ------------------------------------------------------------------------------------------------------------------------------    HOME CARE TIPS:  -COVID19 testing information: [email or web address]  Most pharmacies also offer testing and home test kits. If the Covid19 test is positive and you desire antiviral treatment, please contact a [facility] or schedule a follow up virtual visit through your primary care office or through the [facility].  Other test to treat options: [email or web address]  -I sent the medication(s) we discussed to your pharmacy: Meds ordered this encounter  Medications   benzonatate (TESSALON PERLES) 100 MG capsule    Sig: 1-2 capsules up to twice daily as needed for cough    Dispense:  30 capsule    Refill:  0   molnupiravir EUA (LAGEVRIO) 200 mg CAPS capsule    Sig: Take 4 capsules (800 mg total) by mouth 2 (two) times daily for 5 days.    Dispense:  40  capsule    Refill:  0     -I sent in the [facility] treatment or referral you requested per our discussion. Please see the information provided below and discuss further with the pharmacist/treatment team.   -there is a chance of rebound illness after finishing your treatment. If you become sick again please isolate for an additional 5 days, plus 5 more days of masking.   -can use tylenol or aleve if needed for fevers, aches and pains per instructions  -can use nasal saline a few times per day if you have nasal congestion; sometimes  a short course of Afrin nasal spray for 3 days can help with symptoms as well  -stay hydrated, drink plenty of fluids and eat small healthy meals - avoid dairy  -can take 1000 IU (70mg) Vit D3 and 100-500 mg of Vit C daily per instructions  -If the Covid test is positive, check out the [facility] for more information on home care, transmission and treatment for COVID19  -follow up with your doctor in 2-3 days unless improving and feeling better  -stay home while sick, except to seek medical care. If you have COVID19, ideally it would be best to stay home for a full 10 days since the onset of symptoms PLUS one day of no fever and feeling better. Wear a good mask that fits snugly (such as N95 or KN95) if around others to reduce the risk of transmission.  It was nice to meet you today, and I really hope you are feeling better soon. I help [facility] out with telemedicine visits on Tuesdays and Thursdays and am available for visits on those days. If you have any concerns or questions following this visit please schedule a follow up visit  with your Primary Care doctor or seek care at a local urgent care clinic to avoid delays in care.    Seek in person care or schedule a follow up video visit promptly if your symptoms worsen, new concerns arise or you are not improving with treatment. Call 911 and/or seek emergency care if your symptoms are severe or life  threatening.    Fact Sheet for Patients And Caregivers Emergency Use Authorization (EUA) Of LAGEVRIOT (molnupiravir) capsules For Coronavirus Disease 2019 (COVID-19)  What is the most important information I should know about LAGEVRIO? LAGEVRIO may cause serious side effects, including: ? LAGEVRIO may cause harm to your unborn baby. It is not known if LAGEVRIO will harm your baby if you take LAGEVRIO during pregnancy. o LAGEVRIO is not recommended for use in pregnancy. o LAGEVRIO has not been studied in pregnancy. LAGEVRIO was studied in pregnant animals only. When LAGEVRIO was given to pregnant animals, LAGEVRIO caused harm to their unborn babies. o You and your healthcare provider may decide that you should take LAGEVRIO during pregnancy if there are no other COVID-19 treatment options approved or authorized by the FDA that are accessible or clinically appropriate for you. o If you and your healthcare provider decide that you should take LAGEVRIO during pregnancy, you and your healthcare provider should discuss the known and potential benefits and the potential risks of taking LAGEVRIO during pregnancy. For individuals who are able to become pregnant: ? You should use a reliable method of birth control (contraception) consistently and correctly during treatment with LAGEVRIO and for 4 days after the last dose of LAGEVRIO. Talk to your healthcare provider about reliable birth control methods. ? Before starting treatment with [facility] your healthcare provider may do a pregnancy test to see if you are pregnant before starting treatment with LAGEVRIO. ? Tell your healthcare provider right away if you become pregnant or think you may be pregnant during treatment with LAGEVRIO. Pregnancy Surveillance Program: ? There is a pregnancy surveillance program for individuals who take LAGEVRIO during pregnancy. The purpose of this program is to collect information about the health of you and your  baby. Talk to your healthcare provider about how to take part in this program. ? If you take LAGEVRIO during pregnancy and you agree to participate in the pregnancy surveillance program and allow your healthcare provider to share your information with [facility], then your healthcare provider will report your use of [place] during pregnancy to [place]. by calling [phone] or [email or web address]. For individuals who are sexually active with partners who are able to become pregnant: ? It is not known if LAGEVRIO can affect sperm. While the risk is regarded as low, animal studies to fully assess the potential for LAGEVRIO to affect the babies of males treated with LAGEVRIO have not been completed. A reliable method of birth control (contraception) should be used consistently and correctly during treatment with LAGEVRIO and for at least 3 months after the last dose. The risk to sperm beyond 3 months is not known. Studies to understand the risk to sperm beyond 3 months are ongoing. Talk to your healthcare provider about reliable birth control methods. Talk to your healthcare provider if you have questions or concerns about how LAGEVRIO may affect sperm. You are being given this fact sheet because your healthcare provider believes it is necessary to provide you with LAGEVRIO for the treatment of adults with mild-to-moderate coronavirus disease 2019 (COVID-19) with positive results of direct SARS-CoV-2  viral testing, and who are at high risk for progression to severe COVID-19 including hospitalization or death, and for whom other COVID-19 treatment options approved or authorized by the FDA are not accessible or clinically appropriate. The U.S. Food and Drug Administration (FDA) has issued an Emergency Use Authorization (EUA) to make LAGEVRIO available during the COVID-19 pandemic (for more details about an EUA please see "What is an Emergency Use  Authorization?" at the end of this document). LAGEVRIO is not an FDA-approved medicine in the [place]. Read this Fact Sheet for information about LAGEVRIO. Talk to your healthcare provider about your options if you have any questions. It is your choice to take LAGEVRIO.  What is COVID-19? COVID-19 is caused by a virus called a coronavirus. You can get COVID-19 through close contact with another person who has the virus. COVID-19 illnesses have ranged from very mild-to-severe, including illness resulting in death. While information so far suggests that most COVID-19 illness is mild, serious illness can happen and may cause some of your other medical conditions to become worse. Older people and people of all ages with severe, long lasting (chronic) medical conditions like heart disease, lung disease and diabetes, for example seem to be at higher risk of being hospitalized for COVID-19.  What is LAGEVRIO? LAGEVRIO is an investigational medicine used to treat mild-to-moderate COVID-19 in adults: ? with positive results of direct SARS-CoV-2 viral testing, and ? who are at high risk for progression to severe COVID-19 including hospitalization or death, and for whom other COVID-19 treatment options approved or authorized by the FDA are not accessible or clinically appropriate. The FDA has authorized the emergency use of LAGEVRIO for the treatment of mild-tomoderate COVID-19 in adults under an EUA. For more information on EUA, see the "What is an Emergency Use Authorization (EUA)?" section at the end of this Fact Sheet. LAGEVRIO is not authorized: ? for use in people less than 39 years of age. ? for prevention of COVID-19. ? for people needing hospitalization for COVID-19. ? for use for longer than 5 consecutive days.  What should I tell my healthcare provider before I take LAGEVRIO? Tell your healthcare provider if you: ? Have any allergies ? Are breastfeeding or plan to breastfeed ?  Have any serious illnesses ? Are taking any medicines (prescription, over-the-counter, vitamins, or herbal products).  How do I take LAGEVRIO? ? Take LAGEVRIO exactly as your healthcare provider tells you to take it. ? Take 4 capsules of LAGEVRIO every 12 hours (for example, at 8 am and at 8 pm) ? Take LAGEVRIO for 5 days. It is important that you complete the full 5 days of treatment with LAGEVRIO. Do not stop taking LAGEVRIO before you complete the full 5 days of treatment, even if you feel better. ? Take LAGEVRIO with or without food. ? You should stay in isolation for as long as your healthcare provider tells you to. Talk to your healthcare provider if you are not sure about how to properly isolate while you have COVID-19. ? Swallow LAGEVRIO capsules whole. Do not open, break, or crush the capsules. If you cannot swallow capsules whole, tell your healthcare provider. ? What to do if you miss a dose: o If it has been less than 10 hours since the missed dose, take it as soon as you remember o If it has been more than 10 hours since the missed dose, skip the missed dose and take your dose at the next scheduled time. ? Do not  double the dose of LAGEVRIO to make up for a missed dose.  What are the important possible side effects of LAGEVRIO? ? See, "What is the most important information I should know about LAGEVRIO?" ? Allergic Reactions. Allergic reactions can happen in people taking LAGEVRIO, even after only 1 dose. Stop taking LAGEVRIO and call your healthcare provider right away if you get any of the following symptoms of an allergic reaction: o hives o rapid heartbeat o trouble swallowing or breathing o swelling of the mouth, lips, or face o throat tightness o hoarseness o skin rash The most common side effects of LAGEVRIO are: ? diarrhea ? nausea ? dizziness These are not all the possible side effects of LAGEVRIO. Not many people have taken LAGEVRIO. Serious and  unexpected side effects may happen. This medicine is still being studied, so it is possible that all of the risks are not known at this time.  What other treatment choices are there?  Veklury (remdesivir) is FDA-approved as an intravenous (IV) infusion for the treatment of mildto-moderate WNUUV-25 in certain adults and children. Talk with your doctor to see if Marijean Heath is appropriate for you. Like LAGEVRIO, FDA may also allow for the emergency use of other medicines to treat people with COVID-19. Go to LacrosseProperties.si for more information. It is your choice to be treated or not to be treated with LAGEVRIO. Should you decide not to take it, it will not change your standard medical care.  What if I am breastfeeding? Breastfeeding is not recommended during treatment with LAGEVRIO and for 4 days after the last dose of LAGEVRIO. If you are breastfeeding or plan to breastfeed, talk to your healthcare provider about your options and specific situation before taking LAGEVRIO.  How do I report side effects with LAGEVRIO? Contact your healthcare provider if you have any side effects that bother you or do not go away. Report side effects to FDA MedWatch at SmoothHits.hu or call 1-800-FDA-1088 (1- [phone]).  How should I store [place]? ? Store LAGEVRIO capsules at room temperature between 78F to 46F (20C to 25C). ? Keep LAGEVRIO and all medicines out of the reach of children and pets. How can I learn more about COVID-19? ? Ask your healthcare provider. ? Visit SeekRooms.co.uk ? Contact your local or state public health department. ? Call [facility] at [phone] (toll free in the [place] ? Visit www.molnupiravir.com  What Is an Emergency Use Authorization (EUA)? The [place] FDA has made [place] available under an emergency access mechanism called an  Emergency Use Authorization (EUA) The EUA is supported by a Presenter, broadcasting Health and Human Service (HHS) declaration that circumstances exist to justify emergency use of drugs and biological products during the COVID-19 pandemic. LAGEVRIO for the treatment of mild-to-moderate COVID-19 in adults with positive results of direct SARS-CoV-2 viral testing, who are at high risk for progression to severe COVID-19, including hospitalization or death, and for whom alternative COVID-19 treatment options approved or authorized by FDA are not accessible or clinically appropriate, has not undergone the same type of review as an FDA-approved product. In issuing an EUA under the VZDGL-87 public health emergency, the FDA has determined, among other things, that based on the total amount of scientific evidence available including data from adequate and well-controlled clinical trials, if available, it is reasonable to believe that the product may be effective for diagnosing, treating, or preventing COVID-19, or a serious or life-threatening disease or condition caused by COVID-19; that the known and potential  benefits of the product, when used to diagnose, treat, or prevent such disease or condition, outweigh the known and potential risks of such product; and that there are no adequate, approved, and available alternatives.  All of these criteria must be met to allow for the product to be used in the treatment of patients during the COVID-19 pandemic. The EUA for LAGEVRIO is in effect for the duration of the COVID-19 declaration justifying emergency use of LAGEVRIO, unless terminated or revoked (after which LAGEVRIO may no longer be used under the EUA). For patent information: http://rogers.info/ Copyright  2021-2022 [place] [place], [place] and its affiliates. All rights reserved. [id number] Revised: March 2022

## 2021-02-08 NOTE — Progress Notes (Signed)
Virtual Visit via Video Note  I connected with Stephanie Mcfarland  on 02/08/21 at 10:00 AM EST by a video enabled telemedicine application and verified that I am speaking with the correct person using two identifiers.  Location patient: home, [place] Location provider:work or home office Persons participating in the virtual visit: patient, provider  I discussed the limitations of evaluation and management by telemedicine and the availability of in person appointments. The patient expressed understanding and agreed to proceed.   HPI:  Acute telemedicine visit for Covid19: -Onset: 3-4 days ago -coworker had covid -Symptoms include: started with flu like symptoms w/ body aches, fever, chills, headache, nasal congestion, sore throat, cough -fever is trending down today -Denies:CP, SOB, NVD, inability to eat/drink/get out of bed -Pertinent past medical history:see below -Pertinent medication allergies:  Allergies  Allergen Reactions   Sulfa Antibiotics Itching and Rash    Rash, itching, bumps  -COVID-19 vaccine status: vaccinated with [employer] and then had a pfizer booster -no recent labs -no chance of pregnancy  ROS: See pertinent positives and negatives per HPI.  Past Medical History:  Diagnosis Date   GERD (gastroesophageal reflux disease)    Nonobstructive atherosclerosis of coronary artery 10/22/2017   PAC (premature atrial contraction) 07/19/2017   Palpitations 07/19/2017   Seasonal allergies     Past Surgical History:  Procedure Laterality Date   NO PAST SURGERIES       Current Outpatient Medications:    aspirin EC 81 MG tablet, Take 81 mg by mouth daily., Disp: , Rfl:    benzonatate (TESSALON PERLES) 100 MG capsule, 1-2 capsules up to twice daily as needed for cough, Disp: 30 capsule, Rfl: 0   cetirizine (ZYRTEC) 10 MG tablet, Take 10 mg by mouth daily as needed for allergies., Disp: , Rfl:    medroxyPROGESTERone Acetate 150 MG/ML SUSY, Inject 1 mL into the muscle as directed., Disp: ,  Rfl:    methylPREDNISolone Acetate (DEPO-MEDROL IJ), Inject as directed., Disp: , Rfl:    molnupiravir EUA (LAGEVRIO) 200 mg CAPS capsule, Take 4 capsules (800 mg total) by mouth 2 (two) times daily for 5 days., Disp: 40 capsule, Rfl: 0   Multiple Vitamins-Minerals (MULTIVITAMIN PO), Take 1 tablet by mouth daily., Disp: , Rfl:    rosuvastatin (CRESTOR) 20 MG tablet, Take 1 tablet (20 mg total) by mouth daily., Disp: 90 tablet, Rfl: 3  EXAM:  VITALS per patient if applicable:  GENERAL: alert, oriented, appears well and in no acute distress  HEENT: atraumatic, conjunttiva clear, no obvious abnormalities on inspection of external nose and ears  NECK: normal movements of the head and neck  LUNGS: on inspection no signs of respiratory distress, breathing rate appears normal, no obvious gross SOB, gasping or wheezing  CV: no obvious cyanosis  MS: moves all visible extremities without noticeable abnormality  PSYCH/NEURO: pleasant and cooperative, no obvious depression or anxiety, speech and thought processing grossly intact  ASSESSMENT AND PLAN:  Discussed the following assessment and plan:  COVID-19   Discussed treatment options, ideal treatment window, potential complications, isolation and precautions for COVID-19.  Discussed possibility of rebound with antivirals and the need to reisolate if it should occur for 5 days. Checked for/reviewed any labs done in the last 90 days with GFR listed in HPI if available. After lengthy discussion, the patient is requesting and antiviral and she opted for Legevrio. Discussed EUA status of this drug and the fact that there is preliminary limited knowledge of risks/interactions/side effects per EUA document  vs possible benefits and precautions. This information was shared with patient during the visit and also was provided in patient instructions. Also, advised that patient discuss risks/interactions and use with pharmacist/treatment team as well.  The  patient did want a prescription for cough, Tessalon Rx sent.  Other symptomatic care measures summarized in patient instructions. Work/School slipped offered: provided in patient instructions   Advised to seek prompt in person care if worsening, new symptoms arise, or if is not improving with treatment. Discussed options for inperson care if PCP office not available. Did let this patient know that I only do telemedicine on Tuesdays and Thursdays for [facility]. Advised to schedule follow up visit with PCP or [facility] if any further questions or concerns to avoid delays in care.   I discussed the assessment and treatment plan with the patient. The patient was provided an opportunity to ask questions and all were answered. The patient agreed with the plan and demonstrated an understanding of the instructions.     Lucretia Kern, DO

## 2021-02-13 IMAGING — MG DIGITAL DIAGNOSTIC UNILATERAL LEFT MAMMOGRAM WITH TOMO AND CAD
6 series · 6 of 18 positions shown · non-contrast
Comparison: Previous exam(s).

CLINICAL DATA: 42-year-old female with palpable thickening in the
UPPER-OUTER LEFT breast.

EXAM:
DIGITAL DIAGNOSTIC LEFT MAMMOGRAM WITH CAD AND TOMO
ULTRASOUND LEFT BREAST

[L ML synth-2D]
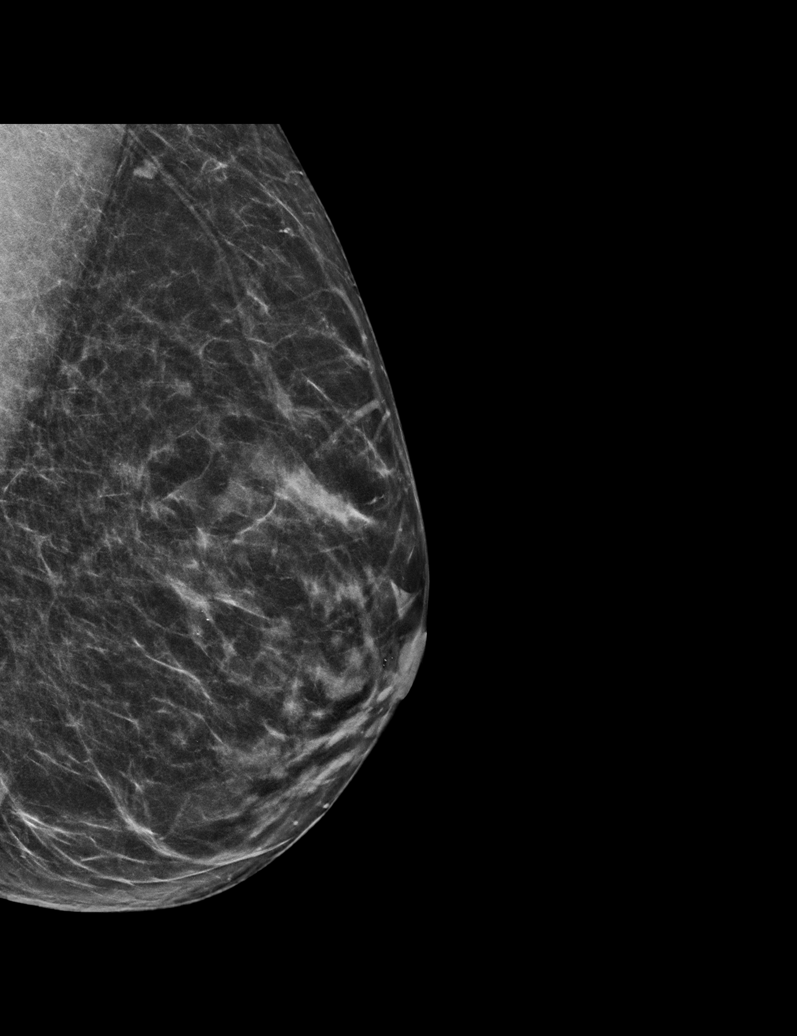

[L MLO synth-2D]
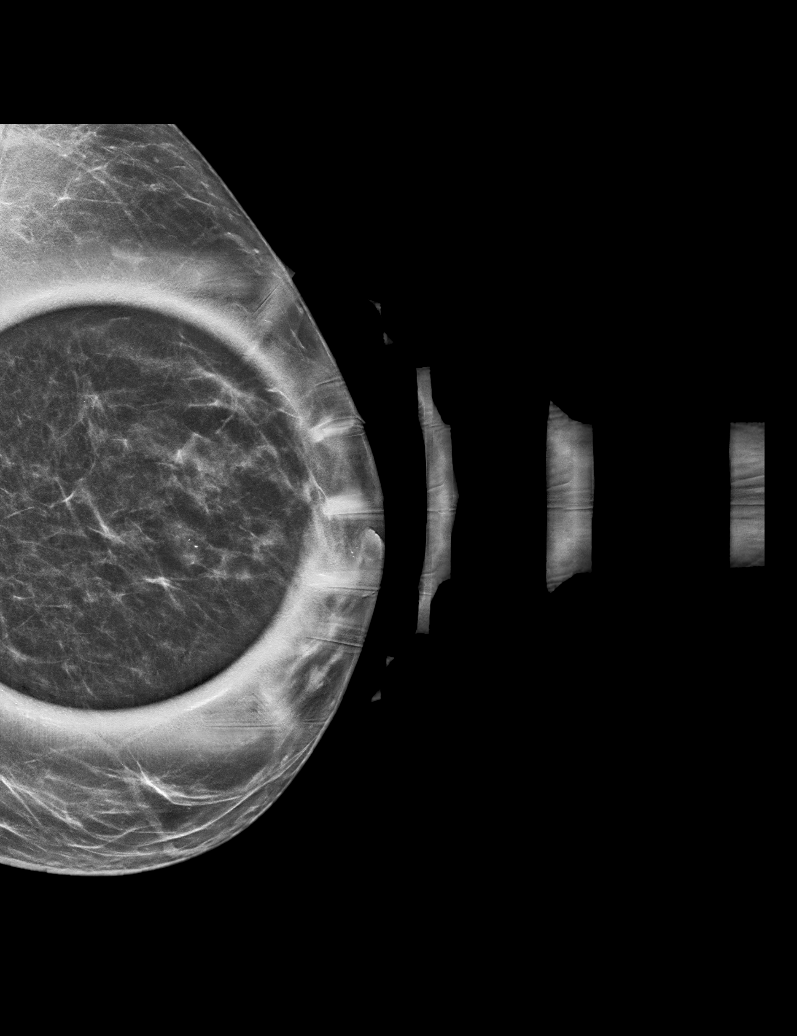

[L CC synth-2D]
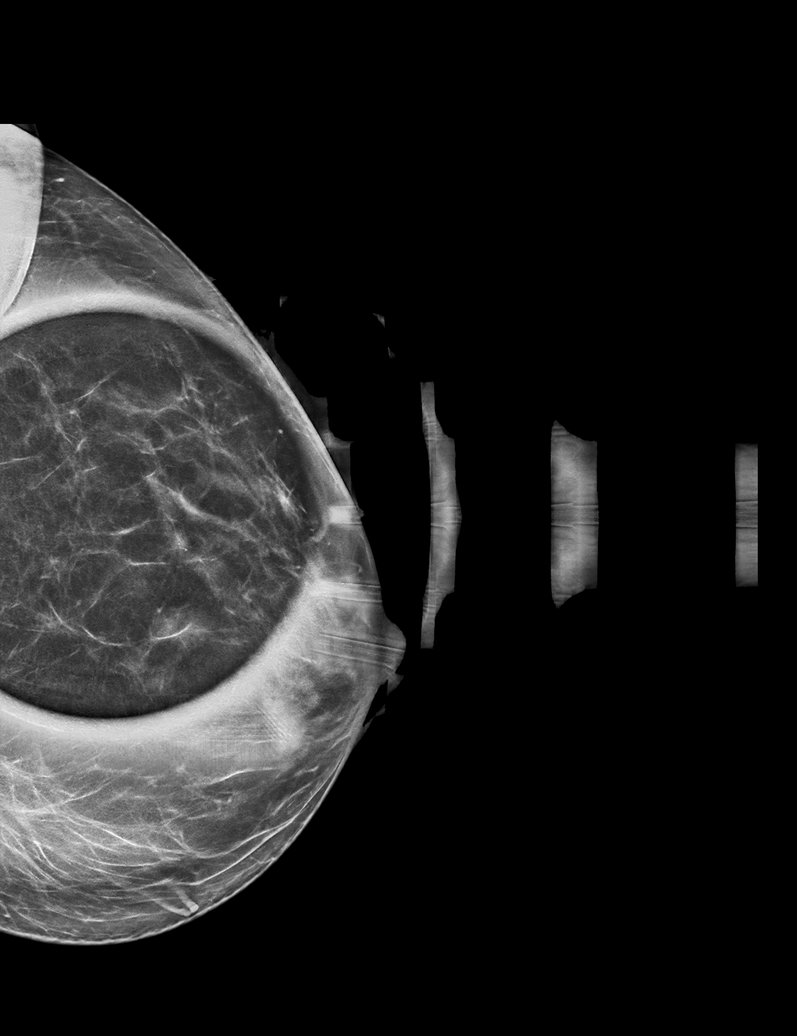

[L MLO tomo · tomo slice 29/57.0]
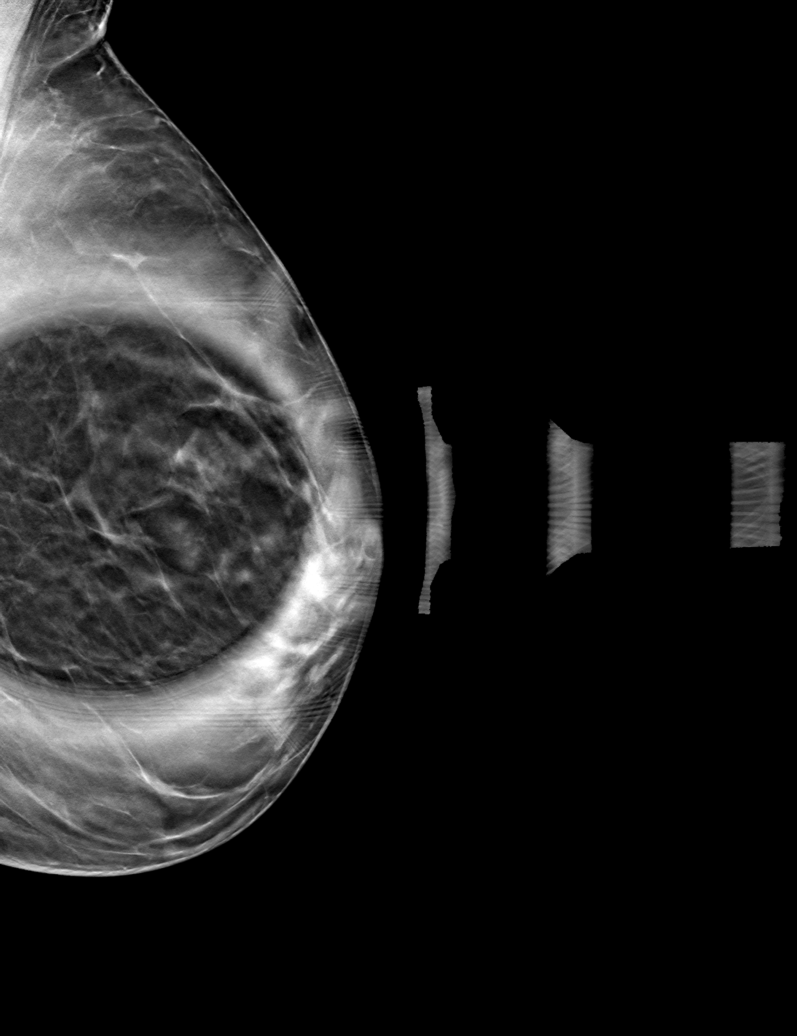

[L ML tomo · tomo slice 30/59.0]
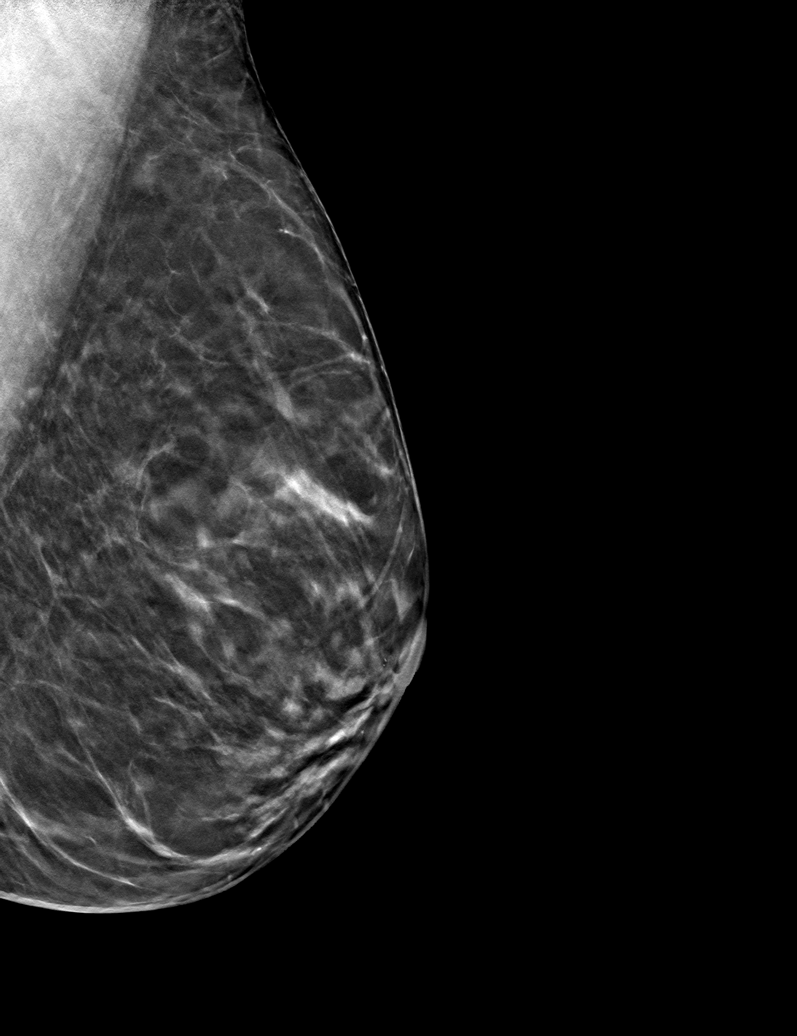

[L CC tomo · tomo slice 31/60.0]
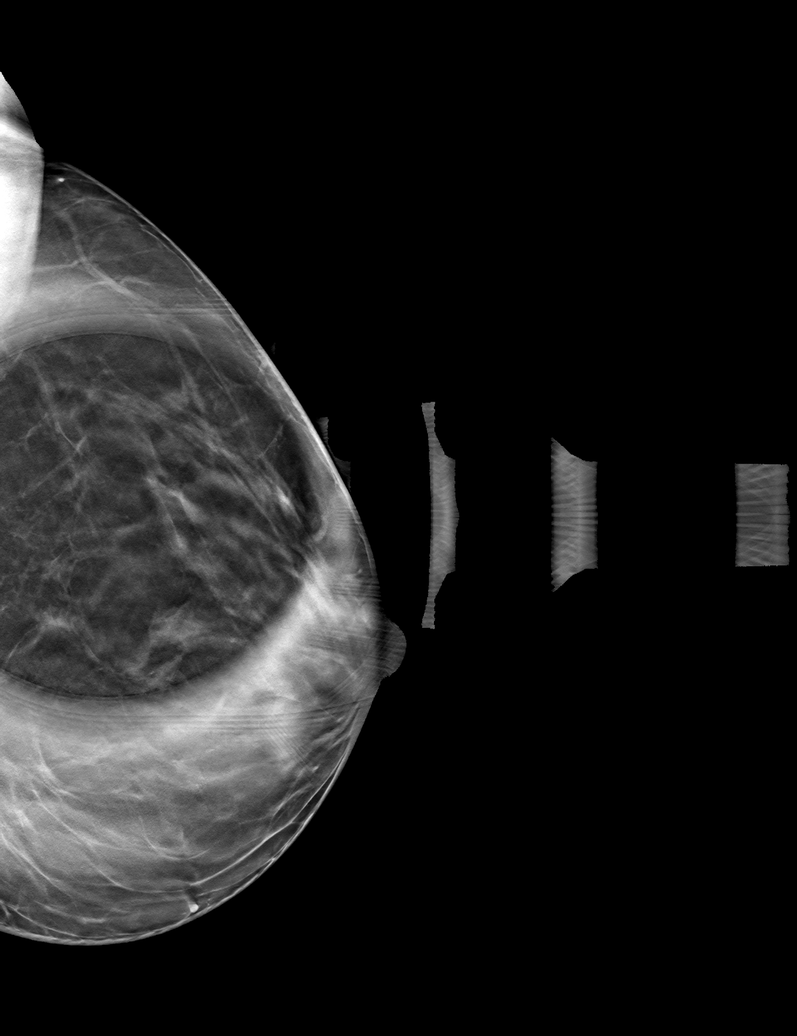

[6 of 18 positions shown; findings below may reference images not displayed]

ACR Breast Density Category b: There are scattered areas of
fibroglandular density.
FINDINGS: 2D/3D full field and spot compression views of the LEFT breast
demonstrate no suspicious mass, distortion or worrisome
calcifications.

Mammographic images were processed with CAD.

On physical exam, minimal thickening in the UPPER OUTER LEFT breast
identified without discrete palpable mass.

Targeted ultrasound is performed, showing no solid or cystic mass,
distortion or abnormal shadowing within the UPPER-OUTER LEFT breast.
IMPRESSION: 1. No mammographic, suspicious palpable or some sonographic
abnormality within the UPPER-OUTER LEFT breast, in the area of
patient concern.
2. No mammographic evidence of LEFT breast malignancy.

RECOMMENDATION:
Bilateral screening mammogram in 1 year

I have discussed the findings and recommendations with the patient.
Results were also provided in writing at the conclusion of the
visit. If applicable, a reminder letter will be sent to the patient
regarding the next appointment.

BI-RADS CATEGORY  1: Negative.

## 2021-02-17 ENCOUNTER — Ambulatory Visit (HOSPITAL_BASED_OUTPATIENT_CLINIC_OR_DEPARTMENT_OTHER): Payer: BC Managed Care – PPO | Admitting: Cardiovascular Disease

## 2021-03-29 NOTE — Progress Notes (Signed)
Cardiology Office Note   Date:  03/30/2021   ID:  Stephanie Mcfarland, DOB Apr 02, 1975, MRN [id number]  PCP:  Caren Macadam, MD  Cardiologist:   Skeet Latch, MD   No chief complaint on file.    History of Present Illness: Stephanie Mcfarland is a 46 y.o. female with non-obstructive CAD, family history of premature CAD and strokes, PVCs, allergies, and GERD here for follow up.  She was initially seen 07/2017 for the evaluation of palpitations.  [name] saw Dr. Garwin Brothers for a routine exam.  At that time she was noted to have ectopy.  She reported that she had previously seen Dr. Einar Gip for palpitations.  In 2015 she wore a Holter monitor that showed occasional PVCs and was otherwise unremarkable.  She reported daily palpitations and was started on metoprolol.  Since that time she has had an improvement in her palpitations.  She reported atypical chest pain that occurred at rest.  Given her family history of premature CAD she was referred for coronary CT-A that revealed mild proximal LAD, proximal LCx, and mid RCA disease.  Coronary calcium score was 469 (99th percentile) on 09/2017.  She was started on aspirin and rosuvastatin.   Of note, she has a strong family history of cardiovascular disease.  Her father had his first MI in his 30s and had a stroke in his 90s.  Her brother had a stroke at age 34 and another brother had premature CAD requiring CABG.    At her last appointment, she continued to complain of palpitations which improved on metoprolol. The dose was not increased but she was informed that she could take an extra 12.5 mg as needed.  Today, she is doing well with no major concerns. She stays active by going to the gym 3 to 4 days a week. She enjoys using the stair machine and free weights with no exertional symptoms. She is doing well on her diet but wants to try meal prepping. She continues to have palpitations but they have not worsened and they do not bother her. She denies any  chest pain, or shortness of breath, lightheadedness, headaches, syncope, orthopnea, PND, lower extremity edema or exertional symptoms.  Past Medical History:  Diagnosis Date   GERD (gastroesophageal reflux disease)    Nonobstructive atherosclerosis of coronary artery 10/22/2017   PAC (premature atrial contraction) 07/19/2017   Palpitations 07/19/2017   Pure hypercholesterolemia 03/30/2021   Seasonal allergies     Past Surgical History:  Procedure Laterality Date   NO PAST SURGERIES       Current Outpatient Medications  Medication Sig Dispense Refill   aspirin EC 81 MG tablet Take 81 mg by mouth daily.     cetirizine (ZYRTEC) 10 MG tablet Take 10 mg by mouth daily as needed for allergies.     medroxyPROGESTERone Acetate 150 MG/ML SUSY Inject 1 mL into the muscle as directed.     Multiple Vitamins-Minerals (MULTIVITAMIN PO) Take 1 tablet by mouth daily.     omeprazole (PRILOSEC) 40 MG capsule Take 40 mg by mouth every morning.     rosuvastatin (CRESTOR) 20 MG tablet Take 1 tablet (20 mg total) by mouth daily. 90 tablet 3   benzonatate (TESSALON PERLES) 100 MG capsule 1-2 capsules up to twice daily as needed for cough 30 capsule 0   methylPREDNISolone Acetate (DEPO-MEDROL IJ) Inject as directed.     No current facility-administered medications for this visit.    Allergies:   Sulfa antibiotics  Social History:  The patient  reports that she has never smoked. She has never used smokeless tobacco. She reports current alcohol use. She reports that she does not use drugs.   Family History:  The patient's family history includes Diabetes in her father and paternal grandfather; Heart attack in her brother and father; Heart disease in her brother, brother, and father; Hyperlipidemia in her mother; Hypertension in her brother and father; Stroke in her brother and father; Thyroid disease in her brother.    ROS:  Please see the history of present illness.   (+) Palpitations All other systems  are reviewed and negative.    PHYSICAL EXAM: VS:  BP 112/78    Pulse 77    Ht 5' 5.05" (1.652 m)    Wt 157 lb 6.4 oz (71.4 kg)    SpO2 99%    BMI 26.15 kg/m  , BMI Body mass index is 26.15 kg/m. GENERAL:  Well appearing HEENT: Pupils equal round and reactive, fundi not visualized, oral mucosa unremarkable NECK:  No jugular venous distention, waveform within normal limits, carotid upstroke brisk and symmetric, no bruits LUNGS:  Clear to auscultation bilaterally HEART:  RRR.  PMI not displaced or sustained,S1 and S2 within normal limits, no S3, no S4, no clicks, no rubs, no murmurs ABD:  Flat, positive bowel sounds normal in frequency in pitch, no bruits, no rebound, no guarding, no midline pulsatile mass, no hepatomegaly, no splenomegaly EXT:  2 plus pulses throughout, no edema, no cyanosis no clubbing SKIN:  No rashes no nodules NEURO:  Cranial nerves II through XII grossly intact, motor grossly intact throughout PSYCH:  Cognitively intact, oriented to person place and time   EKG:   03/30/2021: Sinus rhythm, rate 77 bpm 02/18/2020: Sinus rhythm.  Rate 67 bpm.  Low voltage 02/11/2019: Sinus rhythm.   Rate 78 bpm   07/19/2017 Sinus rhythm.  Rate 81 bpm.  Coronary CT-A 10/09/17: IMPRESSION: 1. Extensive plaque for age and gender, but there appears to be no more than mild stenosis.   2. Coronary artery calcium score 469 Agatston units, placing the patient in the 99th percentile for age and gender. This suggests high risk for future cardiac events.  48-hour Holter 06/2013:  16 PVCs noted.  2 PACs.  Recent Labs: No results found for requested labs within last 8760 hours.   05/08/2017: Sodium 140, potassium 4.1, BUN 10, creatinine 0.78 AST 16, ALT 10 Total cholesterol 188, triglycerides 60, HDL 71, LDL 105 TSH 1.23 WBC 4.7, hemoglobin 12.5, hematocrit 38.3, platelets 260  Lipid Panel    Component Value Date/Time   CHOL 141 02/18/2020 1627   TRIG 43 02/18/2020 1627   HDL 66  02/18/2020 1627   CHOLHDL 2.1 02/18/2020 1627   LDLCALC 65 02/18/2020 1627      Wt Readings from Last 3 Encounters:  03/30/21 157 lb 6.4 oz (71.4 kg)  02/18/20 160 lb (72.6 kg)  02/11/19 154 lb 12.8 oz (70.2 kg)      ASSESSMENT AND PLAN:  PAC (premature atrial contraction) Stable palpitations.  She has not needed metoprolol lately.  Nonobstructive atherosclerosis of coronary artery She has a strong family history of CAD and her calcium score was in the 99th percentile 09/2020.  She continues to remain active and is exercising regularly.  She is consciously working on her diet but notes areas for improvement.  Continue rosuvastatin and check fasting lipids and a CMP.  Continue aspirin.  She will start working on meal prep so  that she can make better food decisions.  Pure hypercholesterolemia Lipids have been well have been controlled with diet, exercise, and rosuvastatin.  She will come back for fasting lipids and a CMP.  LDL goal is less than 70.   Current medicines are reviewed at length with the patient today.  The patient does not have concerns regarding medicines.  The following changes have been made: none  Labs/ tests ordered today include:   No orders of the defined types were placed in this encounter.    Disposition:   FU with Rayder Sullenger C. Oval Linsey, MD, [facility] in 1 year  I,Mykaella Javier,acting as a scribe for Skeet Latch, MD.,have documented all relevant documentation on the behalf of Skeet Latch, MD,as directed by  Skeet Latch, MD while in the presence of Skeet Latch, MD.  I, Jackson Oval Linsey, MD have reviewed all documentation for this visit.  The documentation of the exam, diagnosis, procedures, and orders on 03/30/2021 are all accurate and complete.   Signed, Leta Bucklin C. Oval Linsey, MD, [facility]  03/30/2021 8:49 AM     [facility]

## 2021-03-30 ENCOUNTER — Ambulatory Visit (HOSPITAL_BASED_OUTPATIENT_CLINIC_OR_DEPARTMENT_OTHER): Payer: BC Managed Care – PPO | Admitting: Cardiovascular Disease

## 2021-03-30 ENCOUNTER — Other Ambulatory Visit: Payer: Self-pay

## 2021-03-30 ENCOUNTER — Encounter (HOSPITAL_BASED_OUTPATIENT_CLINIC_OR_DEPARTMENT_OTHER): Payer: Self-pay | Admitting: Cardiovascular Disease

## 2021-03-30 DIAGNOSIS — E7800 Pure hypercholesterolemia, unspecified: Secondary | ICD-10-CM | POA: Diagnosis not present

## 2021-03-30 DIAGNOSIS — I491 Atrial premature depolarization: Secondary | ICD-10-CM

## 2021-03-30 DIAGNOSIS — I2510 Atherosclerotic heart disease of native coronary artery without angina pectoris: Secondary | ICD-10-CM | POA: Diagnosis not present

## 2021-03-30 HISTORY — DX: Pure hypercholesterolemia, unspecified: E78.00

## 2021-03-30 NOTE — Assessment & Plan Note (Signed)
Lipids have been well have been controlled with diet, exercise, and rosuvastatin.  She will come back for fasting lipids and a CMP.  LDL goal is less than 70.

## 2021-03-30 NOTE — Addendum Note (Signed)
Addended by: Gerald Stabs on: 03/30/2021 08:59 AM   Modules accepted: Orders

## 2021-03-30 NOTE — Patient Instructions (Signed)
Medication Instructions:  Your Physician recommend you continue on your current medication as directed.    *If you need a refill on your cardiac medications before your next appointment, please call your pharmacy*   Lab Work: Your physician recommends that you return for lab work in [place]   If you have labs (blood work) drawn today and your tests are completely normal, you will receive your results only by: MyChart Message (if you have MyChart) OR A paper copy in the mail If you have any lab test that is abnormal or we need to change your treatment, we will call you to review the results.   Testing/Procedures: None ordered today    Follow-Up: At [facility], you and your health needs are our priority.  As part of our continuing mission to provide you with exceptional heart care, we have created designated Provider Care Teams.  These Care Teams include your primary Cardiologist (physician) and Advanced Practice Providers (APPs -  Physician Assistants and Nurse Practitioners) who all work together to provide you with the care you need, when you need it.  We recommend signing up for the patient portal called "MyChart".  Sign up information is provided on this After Visit Summary.  MyChart is used to connect with patients for Virtual Visits (Telemedicine).  Patients are able to view lab/test results, encounter notes, upcoming appointments, etc.  Non-urgent messages can be sent to your provider as well.   To learn more about what you can do with MyChart, go to [email or web address].    Your next appointment:   1 year(s)  The format for your next appointment:   In Person  Provider:   Skeet Latch, MD    Other Instructions None today

## 2021-03-30 NOTE — Assessment & Plan Note (Signed)
Stable palpitations.  She has not needed metoprolol lately.

## 2021-03-30 NOTE — Assessment & Plan Note (Signed)
She has a strong family history of CAD and her calcium score was in the 99th percentile 09/2020.  She continues to remain active and is exercising regularly.  She is consciously working on her diet but notes areas for improvement.  Continue rosuvastatin and check fasting lipids and a CMP.  Continue aspirin.  She will start working on meal prep so that she can make better food decisions.

## 2021-04-18 ENCOUNTER — Other Ambulatory Visit: Payer: Self-pay | Admitting: Cardiovascular Disease

## 2021-04-20 LAB — COMPREHENSIVE METABOLIC PANEL
ALT: 10 IU/L (ref 0–32)
AST: 21 IU/L (ref 0–40)
Albumin/Globulin Ratio: 1.8 (ref 1.2–2.2)
Albumin: 4.5 g/dL (ref 3.8–4.8)
Alkaline Phosphatase: 66 IU/L (ref 44–121)
BUN/Creatinine Ratio: 16 (ref 9–23)
BUN: 12 mg/dL (ref 6–24)
Bilirubin Total: 0.4 mg/dL (ref 0.0–1.2)
CO2: 22 mmol/L (ref 20–29)
Calcium: 9.5 mg/dL (ref 8.7–10.2)
Chloride: 104 mmol/L (ref 96–106)
Creatinine, Ser: 0.76 mg/dL (ref 0.57–1.00)
Globulin, Total: 2.5 g/dL (ref 1.5–4.5)
Glucose: 94 mg/dL (ref 70–99)
Potassium: 4.3 mmol/L (ref 3.5–5.2)
Sodium: 140 mmol/L (ref 134–144)
Total Protein: 7 g/dL (ref 6.0–8.5)
eGFR: 98 mL/min/{1.73_m2} (ref >59)

## 2021-04-20 LAB — LIPID PANEL
Chol/HDL Ratio: 2.4 ratio (ref 0.0–4.4)
Cholesterol, Total: 148 mg/dL (ref 100–199)
HDL: 62 mg/dL (ref >39)
LDL Chol Calc (NIH): 76 mg/dL (ref 0–99)
Triglycerides: 46 mg/dL (ref 0–149)
VLDL Cholesterol Cal: 10 mg/dL (ref 5–40)

## 2021-05-30 ENCOUNTER — Other Ambulatory Visit: Payer: Self-pay | Admitting: Cardiovascular Disease

## 2021-06-03 ENCOUNTER — Encounter (HOSPITAL_BASED_OUTPATIENT_CLINIC_OR_DEPARTMENT_OTHER): Payer: Self-pay

## 2021-07-06 ENCOUNTER — Ambulatory Visit: Payer: BC Managed Care – PPO | Admitting: Internal Medicine

## 2021-07-06 ENCOUNTER — Encounter: Payer: Self-pay | Admitting: Internal Medicine

## 2021-07-06 VITALS — BP 110/78 | HR 85 | Temp 98.6°F | Wt 155.7 lb

## 2021-07-06 DIAGNOSIS — H6122 Impacted cerumen, left ear: Secondary | ICD-10-CM | POA: Diagnosis not present

## 2021-07-06 NOTE — Progress Notes (Signed)
? ? ? ?Acute office Visit ? ? ? ? ?This visit occurred during the SARS-CoV-2 public health emergency.  Safety protocols were in place, including screening questions prior to the visit, additional usage of staff PPE, and extensive cleaning of exam room while observing appropriate contact time as indicated for disinfecting solutions.  ? ? ?CC/Reason for Visit: Sensation of foreign body left ear ? ?HPI: Stephanie Mcfarland is a 46 y.o. female who is coming in today for the above mentioned reasons.  For the past week or 2 she has been noticing a fullness of her left ear as if she had a fluid or something in her ear.  She does use Q-tips routinely to clean her ear.  She denies fever, discharge, tinnitus, she has noticed some decreased hearing. ? ?Past Medical/Surgical History: ?Past Medical History:  ?Diagnosis Date  ? GERD (gastroesophageal reflux disease)   ? Nonobstructive atherosclerosis of coronary artery 10/22/2017  ? PAC (premature atrial contraction) 07/19/2017  ? Palpitations 07/19/2017  ? Pure hypercholesterolemia 03/30/2021  ? Seasonal allergies   ? ? ?Past Surgical History:  ?Procedure Laterality Date  ? NO PAST SURGERIES    ? ? ?Social History: ? reports that she has never smoked. She has never used smokeless tobacco. She reports current alcohol use. She reports that she does not use drugs. ? ?Allergies: ?Allergies  ?Allergen Reactions  ? Sulfa Antibiotics Itching and Rash  ?  Rash, itching, bumps  ? ? ?Family History:  ?Family History  ?Problem Relation Age of Onset  ? Hyperlipidemia Mother   ? Diabetes Father   ? Hypertension Father   ? Heart attack Father   ? Stroke Father   ? Heart disease Father   ? Heart disease Brother   ? Hypertension Brother   ? Heart attack Brother   ? Heart disease Brother   ? Diabetes Paternal Grandfather   ? Stroke Brother   ? Thyroid disease Brother   ? ? ? ?Current Outpatient Medications:  ?  aspirin EC 81 MG tablet, Take 81 mg by mouth daily., Disp: , Rfl:  ?  cetirizine (ZYRTEC) 10 MG  tablet, Take 10 mg by mouth daily as needed for allergies., Disp: , Rfl:  ?  medroxyPROGESTERone Acetate 150 MG/ML SUSY, Inject 1 mL into the muscle as directed., Disp: , Rfl:  ?  Multiple Vitamins-Minerals (MULTIVITAMIN PO), Take 1 tablet by mouth daily., Disp: , Rfl:  ?  omeprazole (PRILOSEC) 40 MG capsule, Take 40 mg by mouth every morning., Disp: , Rfl:  ?  rosuvastatin (CRESTOR) 20 MG tablet, TAKE 1 TABLET(20 MG) BY MOUTH DAILY, Disp: 90 tablet, Rfl: 3 ? ?Review of Systems:  ?Constitutional: Denies fever, chills, diaphoresis, appetite change and fatigue.  ?HEENT: Denies photophobia, eye pain, redness,  ear pain, congestion, sore throat, rhinorrhea, sneezing, mouth sores, trouble swallowing, neck pain, neck stiffness and tinnitus.   ?Respiratory: Denies SOB, DOE, cough, chest tightness,  and wheezing.   ?Cardiovascular: Denies chest pain, palpitations and leg swelling.  ?Gastrointestinal: Denies nausea, vomiting, abdominal pain, diarrhea, constipation, blood in stool and abdominal distention.  ?Genitourinary: Denies dysuria, urgency, frequency, hematuria, flank pain and difficulty urinating.  ?Endocrine: Denies: hot or cold intolerance, sweats, changes in hair or nails, polyuria, polydipsia. ?Musculoskeletal: Denies myalgias, back pain, joint swelling, arthralgias and gait problem.  ?Skin: Denies pallor, rash and wound.  ?Neurological: Denies dizziness, seizures, syncope, weakness, light-headedness, numbness and headaches.  ?Hematological: Denies adenopathy. Easy bruising, personal or family bleeding history  ?Psychiatric/Behavioral: Denies suicidal  ideation, mood changes, confusion, nervousness, sleep disturbance and agitation ? ? ? ?Physical Exam: ?Vitals:  ? 07/06/21 1057  ?BP: 110/78  ?Pulse: 85  ?Temp: 98.6 ?F (37 ?C)  ?TempSrc: Oral  ?SpO2: 100%  ?Weight: 155 lb 11.2 oz (70.6 kg)  ? ? ?Body mass index is 25.87 kg/m?. ? ? ?Constitutional: NAD, calm, comfortable ?Eyes: PERRL, lids and conjunctivae  normal ?ENMT: Mucous membranes are moist.  Tympanic membrane is pearly white, no erythema or bulging on the right, left is obstructed by cerumen. ?Psychiatric: Normal judgment and insight. Alert and oriented x 3. Normal mood.  ? ? ?Impression and Plan: ? ?Hearing loss of left ear due to cerumen impaction ? ?-Cerumen Desimpaction ? ?After patient consent was obtained, warm water was applied and gentle ear lavage performed on left ear. There were no complications and following the desimpaction the tympanic membranes were visible.  It was initially thought there to be a perforation of the left tympanic membrane, but upon repeat visualization it appears to be a shadow.  Tympanic membranes are intact following the procedure. Auditory canals are normal. The patient reported relief of symptoms after removal of cerumen. ? ? ? ?Time spent:22 minutes reviewing chart, interviewing and examining patient and formulating plan of care. ? ? ? ? ? ?Lelon Frohlich, MD ?[facility] ? ? ?

## 2022-04-20 DIAGNOSIS — Z3042 Encounter for surveillance of injectable contraceptive: Secondary | ICD-10-CM | POA: Diagnosis not present

## 2022-05-05 ENCOUNTER — Ambulatory Visit (HOSPITAL_BASED_OUTPATIENT_CLINIC_OR_DEPARTMENT_OTHER): Payer: BC Managed Care – PPO | Admitting: Family

## 2022-06-05 ENCOUNTER — Ambulatory Visit (HOSPITAL_BASED_OUTPATIENT_CLINIC_OR_DEPARTMENT_OTHER): Payer: BC Managed Care – PPO | Admitting: Family

## 2022-06-07 DIAGNOSIS — N6321 Unspecified lump in the left breast, upper outer quadrant: Secondary | ICD-10-CM | POA: Diagnosis not present

## 2022-06-13 DIAGNOSIS — N6002 Solitary cyst of left breast: Secondary | ICD-10-CM | POA: Diagnosis not present

## 2022-06-13 DIAGNOSIS — R928 Other abnormal and inconclusive findings on diagnostic imaging of breast: Secondary | ICD-10-CM | POA: Diagnosis not present

## 2022-07-10 DIAGNOSIS — Z3042 Encounter for surveillance of injectable contraceptive: Secondary | ICD-10-CM | POA: Diagnosis not present

## 2022-07-11 ENCOUNTER — Other Ambulatory Visit: Payer: Self-pay | Admitting: Cardiovascular Disease

## 2022-07-11 NOTE — Telephone Encounter (Signed)
Rx(s) sent to pharmacy electronically.  

## 2022-07-17 ENCOUNTER — Ambulatory Visit (HOSPITAL_BASED_OUTPATIENT_CLINIC_OR_DEPARTMENT_OTHER): Payer: BC Managed Care – PPO | Admitting: Family

## 2022-07-17 ENCOUNTER — Encounter (HOSPITAL_BASED_OUTPATIENT_CLINIC_OR_DEPARTMENT_OTHER): Payer: Self-pay | Admitting: Family

## 2022-07-17 VITALS — BP 110/82 | HR 72 | Ht 65.0 in | Wt 162.5 lb

## 2022-07-17 DIAGNOSIS — E785 Hyperlipidemia, unspecified: Secondary | ICD-10-CM | POA: Diagnosis not present

## 2022-07-17 DIAGNOSIS — I2510 Atherosclerotic heart disease of native coronary artery without angina pectoris: Secondary | ICD-10-CM

## 2022-07-17 DIAGNOSIS — I493 Ventricular premature depolarization: Secondary | ICD-10-CM

## 2022-07-17 DIAGNOSIS — Z8249 Family history of ischemic heart disease and other diseases of the circulatory system: Secondary | ICD-10-CM

## 2022-07-17 MED ORDER — ROSUVASTATIN CALCIUM 20 MG PO TABS
20.0000 mg | ORAL_TABLET | Freq: Every day | ORAL | 3 refills | Status: DC
Start: 2022-07-17 — End: 2023-07-23

## 2022-07-17 MED ORDER — ASPIRIN 81 MG PO TBEC: 81.0000 mg | DELAYED_RELEASE_TABLET | Freq: Every day | ORAL | 0 refills | Status: AC

## 2022-07-17 NOTE — Progress Notes (Signed)
Office Visit    Patient Name: Stephanie Mcfarland Date of Encounter: 07/17/2022  PCP:  No primary care provider on file.    [facility]  Cardiologist:  Chilton Si, MD  Advanced Practice Provider:  No care team member to display Electrophysiologist:  None      Chief Complaint    Stephanie Mcfarland is a 47 y.o. female presents today for follow up of nonobstructive coronary disedase   Past Medical History    Past Medical History:  Diagnosis Date   GERD (gastroesophageal reflux disease)    Nonobstructive atherosclerosis of coronary artery 10/22/2017   PAC (premature atrial contraction) 07/19/2017   Palpitations 07/19/2017   Pure hypercholesterolemia 03/30/2021   Seasonal allergies    Past Surgical History:  Procedure Laterality Date   NO PAST SURGERIES      Allergies  Allergies  Allergen Reactions   Sulfa Antibiotics Itching and Rash    Rash, itching, bumps    History of Present Illness    Stephanie Mcfarland is a 47 y.o. female with a hx of nonobstructive coronary disease, PVC, allergies, GERD last seen on/11/23.  Family history notable for premature coronary disease, stroke.  Established 07/2017 for palpitations at the request of her OB/GYN.  Prior evaluation 2015 with Holter monitor revealing occasional PVC and otherwise unremarkable.  She was started on metoprolol with improvement in symptoms.  Reported atypical chest pain.  Subsequent coronary CTA/2019 mild proximal LAD, proximal LCx, mid RCA disease.  Coronary calcium score 469 placing her in the 99th percentile.  She was started on aspirin and rosuvastatin.  Admits that 02/2020 noted palpitations and was informed he could take an extra 12.5 mg of metoprolol as needed.  Last seen 03/2021 doing well without cardiac symptoms.  Exercising 3 to 4 days/week at the gym.  Presents today for follow-up. Working as a Water quality scientist. Notes intermittent palpitations - no longer taking Metoprolol as did not feel it  made impact. Enjoys running and training at [employer] for exercise. Endorses following heart healthy diet.   Reports no shortness of breath nor dyspnea on exertion. Reports no chest pain, pressure, or tightness. No edema, orthopnea, PND.  EKGs/Labs/Other Studies Reviewed:   The following studies were reviewed today: Cardiac Studies & Procedures          CT SCANS  CT CORONARY MORPH W/CTA COR W/SCORE 10/09/2017  Addendum 10/09/2017  5:50 PM ADDENDUM REPORT: 10/09/2017 17:48  CLINICAL DATA:  Chest pain  EXAM: Cardiac CTA  MEDICATIONS: Sub lingual nitro. 4mg  and lopressor mg  TECHNIQUE: The patient was scanned on a Siemens 192 slice scanner. Gantry rotation speed was 250 msecs. Collimation was 0.8 mm. A 100 kV prospective scan was triggered in the ascending thoracic aorta at 35-75% of the R-R interval. Average HR during the scan was 60 bpm. The 3D data set was interpreted on a dedicated work station using MPR, MIP and VRT modes. A total of 80cc of contrast was used.  FINDINGS: Non-cardiac: See separate report from [facility].  Calcium Score: 469 Agatston units.  Coronary Arteries: Right dominant with no anomalies  LM: No plaque or stenosis.  LAD system: Calcified plaque with mild stenosis in the proximal LAD. Misregistration artifact in the mid LAD but this does not affect the ability to read the study.  Circumflex system: Relatively small vessel. Calcified plaque in the proximal LCx with mild stenosis.  RCA system: Mixed plaque in the proximal and mid RCA with mild stenosis. Misregistration  artifact in the mid RCA but this does not affect ability to read study.  IMPRESSION: 1. Extensive plaque for age and gender, but there appears to be no more than mild stenosis.  2. Coronary artery calcium score 469 Agatston units, placing the patient in the 99th percentile for age and gender. This suggests high risk for future cardiac events.  Dalton  Mclean   Electronically Signed By: Marca Ancona M.D. On: 10/09/2017 17:48  Narrative EXAM: OVER-READ INTERPRETATION  CT CHEST  The following report is an over-read performed by radiologist Dr. Charlett Nose of [facility] on 10/09/2017. This over-read does not include interpretation of cardiac or coronary anatomy or pathology. The coronary CTA interpretation by the cardiologist is attached.  COMPARISON:  08/21/2017  FINDINGS: Vascular: Visualized aorta is normal caliber. Heart is upper normal size.  Mediastinum/Nodes: No adenopathy in the lower mediastinum or hila.  Lungs/Pleura: Visualized lungs clear.  No effusions.  Upper Abdomen: Imaging into the upper abdomen shows no acute findings.  Musculoskeletal: Chest wall soft tissues are unremarkable. No acute bony abnormality.  IMPRESSION: No acute or significant extracardiac abnormality.  Electronically Signed: By: Charlett Nose M.D. On: 10/09/2017 10:12   CT SCANS  CT CARDIAC SCORING (SELF PAY ONLY) 08/21/2017  Addendum 08/21/2017 11:53 AM ADDENDUM REPORT: 08/21/2017 11:51  CLINICAL DATA:  Risk stratification  EXAM: Coronary Calcium Score  TECHNIQUE: The patient was scanned on a [employer] scanner. Axial non-contrast 3 mm slices were carried out through the heart. The data set was analyzed on a dedicated work station and scored using the Agatson method.  FINDINGS: Non-cardiac: See separate report from [facility].  Ascending Aorta: Normal size, no calcifications.  Pericardium: Normal.  Coronary arteries: Normal origin.  IMPRESSION: Coronary calcium score of 516 (in all 3 coronary arteries). This was 52 percentile for age and sex matched control. Consider a coronary CTA for further evaluation given very high calcium score.   Electronically Signed By: Tobias Alexander On: 08/21/2017 11:51  Narrative EXAM: OVER-READ INTERPRETATION  CT CHEST  The following report is an  over-read performed by radiologist Dr. Charlett Nose of [facility] on 08/21/2017. This over-read does not include interpretation of cardiac or coronary anatomy or pathology. The coronary calcium score interpretation by the cardiologist is attached.  COMPARISON:  CT 03/20/2015  FINDINGS: Vascular: Heart is normal size.  Visualized aorta is normal caliber.  Mediastinum/Nodes: No adenopathy in the lower mediastinum or hila.  Lungs/Pleura: Visualized lungs clear.  No effusions.  Upper Abdomen: Imaging into the upper abdomen shows no acute findings.  Musculoskeletal: No acute bony abnormality. Chest wall soft tissues are unremarkable.  IMPRESSION: No acute or significant extracardiac abnormality.  Electronically Signed: By: Charlett Nose M.D. On: 08/21/2017 08:50           EKG:  EKG is  ordered today.  The ekg ordered today demonstrates NSR 72 bpm with no acute ST/T wave changes.   Recent Labs: No results found for requested labs within last 365 days.  Recent Lipid Panel    Component Value Date/Time   CHOL 148 04/19/2021 0836   TRIG 46 04/19/2021 0836   HDL 62 04/19/2021 0836   CHOLHDL 2.4 04/19/2021 0836   LDLCALC 76 04/19/2021 0836   Home Medications   Current Meds  Medication Sig   aspirin EC 81 MG tablet Take 81 mg by mouth daily.   aspirin EC 81 MG tablet Take 1 tablet (81 mg total) by mouth daily. Swallow  whole.   cetirizine (ZYRTEC) 10 MG tablet Take 10 mg by mouth daily as needed for allergies.   medroxyPROGESTERone Acetate 150 MG/ML SUSY Inject 1 mL into the muscle as directed.   Multiple Vitamins-Minerals (MULTIVITAMIN PO) Take 1 tablet by mouth daily.   omeprazole (PRILOSEC) 40 MG capsule Take 40 mg by mouth as needed.   [DISCONTINUED] rosuvastatin (CRESTOR) 20 MG tablet Take 1 tablet (20 mg total) by mouth daily. KEEP APPOINTMENT     Review of Systems      All other systems reviewed and are otherwise negative except as noted above.  Physical  Exam    VS:  BP 110/82 (BP Location: Left Arm, Patient Position: Sitting, Cuff Size: Normal)   Pulse 72   Ht 5\' 5"  (1.651 m)   Wt 162 lb 8 oz (73.7 kg)   BMI 27.04 kg/m  , BMI Body mass index is 27.04 kg/m.  Wt Readings from Last 3 Encounters:  07/17/22 162 lb 8 oz (73.7 kg)  07/06/21 155 lb 11.2 oz (70.6 kg)  03/30/21 157 lb 6.4 oz (71.4 kg)     GEN: Well nourished, well developed, in no acute distress. HEENT: normal. Neck: Supple, no JVD, carotid bruits, or masses. Cardiac: RRR, no murmurs, rubs, or gallops. No clubbing, cyanosis, edema.  Radials/PT 2+ and equal bilaterally.  Respiratory:  Respirations regular and unlabored, clear to auscultation bilaterally. GI: Soft, nontender, nondistended. MS: No deformity or atrophy. Skin: Warm and dry, no rash. Neuro:  Strength and sensation are intact. Psych: Normal affect.  Assessment & Plan    Nonobstructive coronary artery disease-coronary CTA 10/09/2017 with coronary calcium score 469 placing her 90th percentile with overall no more than mild stenosis.  Reports no anginal symptoms.  Discussed that we could update cardiac CTA as she is 5 years from last study but has no symptoms we agreed to defer today.  Continue GDMT aspirin, rosuvastatin 20 mg daily.  Update CMP, LP(a), lipid panel.  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 Continue Crestor 20QD. If LDL not <70 consider addition of Bempedoic acid or PCSK9i.   PVC-reports intermittent palpitations lasting seconds.  Discussed reducing caffeine/eoh intake, managing stress well, staying hydrated. Did not feel Metoprolol helped much with symptoms. Offered PRN Diltiazem and ZIO monitor but as symptoms intermittent deferred.  Plan for CBC, magnesium, CMP, thyroid panel to rule out anemia, electrolyte abnormality, thyroid abnormality as etiology of palpitations.        Disposition: Follow up in 1 year(s) with Chilton Si, MD or APP.  Signed, Alver Sorrow, NP 07/17/2022, 11:03 AM [facility]

## 2022-07-17 NOTE — Patient Instructions (Addendum)
Medication Instructions:  Your physician recommends that you continue on your current medications as directed. Please refer to the Current Medication list given to you today.  *If you need a refill on your cardiac medications before your next appointment, please call your pharmacy*   Lab Work: Your physician recommends that you return for lab work today- CMP, Lipid Panel, Thyroid Panel, magnesium, cbc, Lpa  If you have labs (blood work) drawn today and your tests are completely normal, you will receive your results only by: MyChart Message (if you have MyChart) OR A paper copy in the mail If you have any lab test that is abnormal or we need to change your treatment, we will call you to review the results.  Follow-Up: At [facility] , you and your health needs are our priority.  As part of our continuing mission to provide you with exceptional heart care, we have created designated Provider Care Teams.  These Care Teams include your primary Cardiologist (physician) and Advanced Practice Providers (APPs -  Physician Assistants and Nurse Practitioners) who all work together to provide you with the care you need, when you need it.  We recommend signing up for the patient portal called "MyChart".  Sign up information is provided on this After Visit Summary.  MyChart is used to connect with patients for Virtual Visits (Telemedicine).  Patients are able to view lab/test results, encounter notes, upcoming appointments, etc.  Non-urgent messages can be sent to your provider as well.   To learn more about what you can do with MyChart, go to [email or web address].    Your next appointment:   1 year(s)  Provider:   Chilton Si, MD or Gillian Shields, NP    Other Instructions Cholesterol medication options pending lab results: Bempedoic acid (Nexletol) oral tablet PCSK9 inhibitor (Repatha) a once every 14 days subcutaneous injection  To prevent palpitations: Make sure you are  adequately hydrated.  Avoid and/or limit caffeine containing beverages like soda or tea. Exercise regularly.  Manage stress well. Some over the counter medications can cause palpitations such as Benadryl, AdvilPM, TylenolPM. Regular Advil or Tylenol do not cause palpitations.    Heart Healthy Diet Recommendations: A low-salt diet is recommended. Meats should be grilled, baked, or boiled. Avoid fried foods. Focus on lean protein sources like fish or chicken with vegetables and fruits. The American Heart Association is a Chief Technology Officer!  American Heart Association Diet and Lifeystyle Recommendations   Exercise recommendations: The American Heart Association recommends 150 minutes of moderate intensity exercise weekly. Try 30 minutes of moderate intensity exercise 4-5 times per week. This could include walking, jogging, or swimming.

## 2022-07-19 LAB — THYROID PANEL WITH TSH
Free Thyroxine Index: 1.8 (ref 1.2–4.9)
T3 Uptake Ratio: 25 % (ref 24–39)
T4, Total: 7 ug/dL (ref 4.5–12.0)
TSH: 1.1 u[IU]/mL (ref 0.450–4.500)

## 2022-07-19 LAB — COMPREHENSIVE METABOLIC PANEL
ALT: 16 IU/L (ref 0–32)
AST: 22 IU/L (ref 0–40)
Albumin/Globulin Ratio: 1.6 (ref 1.2–2.2)
Albumin: 4.6 g/dL (ref 3.9–4.9)
Alkaline Phosphatase: 67 IU/L (ref 44–121)
BUN/Creatinine Ratio: 13 (ref 9–23)
BUN: 11 mg/dL (ref 6–24)
Bilirubin Total: 1 mg/dL (ref 0.0–1.2)
CO2: 19 mmol/L — ABNORMAL LOW (ref 20–29)
Calcium: 10.2 mg/dL (ref 8.7–10.2)
Chloride: 102 mmol/L (ref 96–106)
Creatinine, Ser: 0.87 mg/dL (ref 0.57–1.00)
Globulin, Total: 2.8 g/dL (ref 1.5–4.5)
Glucose: 86 mg/dL (ref 70–99)
Potassium: 4.5 mmol/L (ref 3.5–5.2)
Sodium: 139 mmol/L (ref 134–144)
Total Protein: 7.4 g/dL (ref 6.0–8.5)
eGFR: 83 mL/min/{1.73_m2} (ref >59)

## 2022-07-19 LAB — LIPID PANEL
Chol/HDL Ratio: 2.4 ratio (ref 0.0–4.4)
Cholesterol, Total: 172 mg/dL (ref 100–199)
HDL: 73 mg/dL (ref >39)
LDL Chol Calc (NIH): 90 mg/dL (ref 0–99)
Triglycerides: 45 mg/dL (ref 0–149)
VLDL Cholesterol Cal: 9 mg/dL (ref 5–40)

## 2022-07-19 LAB — CBC
Hematocrit: 43.8 % (ref 34.0–46.6)
Hemoglobin: 14.5 g/dL (ref 11.1–15.9)
MCH: 28 pg (ref 26.6–33.0)
MCHC: 33.1 g/dL (ref 31.5–35.7)
MCV: 85 fL (ref 79–97)
Platelets: 238 10*3/uL (ref 150–450)
RBC: 5.18 x10E6/uL (ref 3.77–5.28)
RDW: 12.2 % (ref 11.7–15.4)
WBC: 4.2 10*3/uL (ref 3.4–10.8)

## 2022-07-19 LAB — MAGNESIUM: Magnesium: 2.1 mg/dL (ref 1.6–2.3)

## 2022-07-19 LAB — LIPOPROTEIN A (LPA): Lipoprotein (a): 252 nmol/L — ABNORMAL HIGH (ref <75.0)

## 2022-07-20 ENCOUNTER — Telehealth (HOSPITAL_BASED_OUTPATIENT_CLINIC_OR_DEPARTMENT_OTHER): Payer: Self-pay

## 2022-07-20 DIAGNOSIS — E785 Hyperlipidemia, unspecified: Secondary | ICD-10-CM

## 2022-07-20 NOTE — Telephone Encounter (Addendum)
Left message for patient to call back     ----- Message from Alver Sorrow, NP sent at 07/19/2022  9:19 AM EDT ----- Normal kidneys, liver, electrolytes. Normal thyroid, electrolytes. CBC with no evidence of anemia nor infection.    Lipoprotein (a) elevated indicating familial hyperlipidemia and increased risk for future cardiovascular events. LDL (bad cholesterol) of 90 with goal of less than 70. Given elevated Lp(a) would recommend addition of PCSK9i - this is a once every 14 days injection to help lower cholesterol, lower cardiovascular risk. If she is agreeable, can Rx. If she would like, can meet with pharmacy lipid clinic to discuss further.

## 2022-07-21 NOTE — Telephone Encounter (Signed)
2nd call attempt, reviewed the following recommendations, patient is hesitant about new medications but is agreeable to meeting with the lipid team for discuss options. Ref placed, patient aware that she will get a call from scheduling team.          ----- Message from Alver Sorrow, NP sent at 07/19/2022  9:19 AM EDT ----- Normal kidneys, liver, electrolytes. Normal thyroid, electrolytes. CBC with no evidence of anemia nor infection.     Lipoprotein (a) elevated indicating familial hyperlipidemia and increased risk for future cardiovascular events. LDL (bad cholesterol) of 90 with goal of less than 70. Given elevated Lp(a) would recommend addition of PCSK9i - this is a once every 14 days injection to help lower cholesterol, lower cardiovascular risk. If she is agreeable, can Rx. If she would like, can meet with pharmacy lipid clinic to discuss further.

## 2022-07-21 NOTE — Addendum Note (Signed)
Addended by: Marlene Lard on: 07/21/2022 08:52 AM   Modules accepted: Orders

## 2022-08-07 DIAGNOSIS — M25532 Pain in left wrist: Secondary | ICD-10-CM | POA: Diagnosis not present

## 2022-08-17 DIAGNOSIS — Z1159 Encounter for screening for other viral diseases: Secondary | ICD-10-CM | POA: Diagnosis not present

## 2022-08-17 DIAGNOSIS — Z131 Encounter for screening for diabetes mellitus: Secondary | ICD-10-CM | POA: Diagnosis not present

## 2022-08-17 DIAGNOSIS — Z113 Encounter for screening for infections with a predominantly sexual mode of transmission: Secondary | ICD-10-CM | POA: Diagnosis not present

## 2022-08-17 DIAGNOSIS — Z01419 Encounter for gynecological examination (general) (routine) without abnormal findings: Secondary | ICD-10-CM | POA: Diagnosis not present

## 2022-08-17 DIAGNOSIS — Z114 Encounter for screening for human immunodeficiency virus [HIV]: Secondary | ICD-10-CM | POA: Diagnosis not present

## 2022-08-28 ENCOUNTER — Encounter: Payer: Self-pay | Admitting: Pharmacist Clinician (PhC)/ Clinical Pharmacy Specialist

## 2022-08-28 ENCOUNTER — Ambulatory Visit
Payer: BC Managed Care – PPO | Attending: Internal Medicine | Admitting: Pharmacist Clinician (PhC)/ Clinical Pharmacy Specialist

## 2022-08-28 DIAGNOSIS — E7800 Pure hypercholesterolemia, unspecified: Secondary | ICD-10-CM

## 2022-08-28 NOTE — Assessment & Plan Note (Signed)
Assessment: Patient with ASCVD not at LDL goal of < 70 Most recent LDL 90 on 07/14/22 Has been compliant with high intensity statin :  rosuvastatin 20 mg   Reviewed options for lowering LDL cholesterol, including ezetimibe, PCSK-9 inhibitors, bempedoic acid and inclisiran.  Discussed mechanisms of action, dosing, side effects, potential decreases in LDL cholesterol and costs.  Also reviewed potential options for patient assistance.  Plan: Patient agreeable to starting Repatha 140 mg q14d Repeat labs after:  3 months Lipid Liver function Patient was given information on Amgen cpoay card - she is aware to reach out to the office if medication is cost prohibitive.

## 2022-08-28 NOTE — Patient Instructions (Signed)
Your Results:             Your most recent labs Goal  Total Cholesterol 172 < 200  Triglycerides 45 < 150  HDL (happy/good cholesterol) 73 > 40  LDL (lousy/bad cholesterol 90 < 70   Medication changes:  We will start the process to get Repatha covered by your insurance.  Once the prior authorization is complete, I will call/send a MyChart message to let you know and confirm pharmacy information.   You will take one injection every 14 days.   Lab orders:  We want to repeat labs after 2-3 months.  We will send you a lab order to remind you once we get closer to that time.      Thank you for choosing [facility]

## 2022-08-28 NOTE — Progress Notes (Signed)
Office Visit    Patient Name: [name] Date of Encounter: 08/28/2022  Primary Care Provider:  Patient, No Pcp Per Primary Cardiologist:  Chilton Si, MD  Chief Complaint    Hyperlipidemia   Significant Past Medical History   CAD Calcium score 469 (99th percentile) - 09/2017  PAC Previously on metoprolol, currently not symptomatic     Allergies  Allergen Reactions   Sulfa Antibiotics Itching and Rash    Rash, itching, bumps    History of Present Illness    Stephanie Mcfarland is a 47 y.o. female patient of Dr Duke Salvia, in the office today to discuss options for cholesterol management.  In 2019 she had a coronary calcium score of 469 and was treating with rosuvastatin 20 mg.  Recently an Lp(a) was drawn and found to be elevated at 252.  Because of her strong family history of coronary disease, she was referred to the PharmD clinic.    Insurance Carrier:  [employer]  LDL Cholesterol goal: LDL < 70   Current Medications:   rosuvastatin 20 mg qd   (May 2019)  Family Hx:  father stroke late 85's, CABG around 10) died at 34 from MI; mother living hypertension, high cholesterol - 3 brothers 1 CABG and TIA (68 at stroke, 71 at cabg) another 2 MI ,CABG, (73) ; son 31 no known issues  Social Hx: Tobacco: no Alcohol:    socially/ocasionally   Diet:    mostly home cooked;; protein fish and chicken mostly , occasional beef; loves veggies - fresh - kale, spinach. Beans; not snacking much - wheat goldfinsh, berries  Exercise: runs - at least 2 days per week - , gym 3 days    Accessory Clinical Findings   07/17/22 Lp(a) 252  Lab Results  Component Value Date   CHOL 172 07/17/2022   HDL 73 07/17/2022   LDLCALC 90 07/17/2022   TRIG 45 07/17/2022   CHOLHDL 2.4 07/17/2022    Lab Results  Component Value Date   ALT 16 07/17/2022   AST 22 07/17/2022   ALKPHOS 67 07/17/2022   BILITOT 1.0 07/17/2022   Lab Results  Component Value Date   CREATININE 0.87  07/17/2022   BUN 11 07/17/2022   NA 139 07/17/2022   K 4.5 07/17/2022   CL 102 07/17/2022   CO2 19 (L) 07/17/2022   No results found for: "HGBA1C"  Home Medications    Current Outpatient Medications  Medication Sig Dispense Refill   aspirin EC 81 MG tablet Take 1 tablet (81 mg total) by mouth daily. Swallow whole. 96 tablet 0   cetirizine (ZYRTEC) 10 MG tablet Take 10 mg by mouth daily as needed for allergies.     medroxyPROGESTERone Acetate 150 MG/ML SUSY Inject 1 mL into the muscle as directed.     Multiple Vitamins-Minerals (MULTIVITAMIN PO) Take 1 tablet by mouth daily.     omeprazole (PRILOSEC) 40 MG capsule Take 40 mg by mouth as needed.     rosuvastatin (CRESTOR) 20 MG tablet Take 1 tablet (20 mg total) by mouth daily. 90 tablet 3   No current facility-administered medications for this visit.     Assessment & Plan    Pure hypercholesterolemia Assessment: Patient with ASCVD not at LDL goal of < 70 Most recent LDL 90 on 07/14/22 Has been compliant with high intensity statin :  rosuvastatin 20 mg   Reviewed options for lowering LDL cholesterol, including ezetimibe, PCSK-9 inhibitors, bempedoic acid and inclisiran.  Discussed mechanisms of action, dosing, side effects, potential decreases in LDL cholesterol and costs.  Also reviewed potential options for patient assistance.  Plan: Patient agreeable to starting Repatha 140 mg q14d Repeat labs after:  3 months Lipid Liver function Patient was given information on Amgen cpoay card - she is aware to reach out to the office if medication is cost prohibitive.    Phillips Hay, PharmD CPP [facility] [address]  [place], [place] [address] [phone]  08/28/2022, 7:00 PM

## 2022-08-29 ENCOUNTER — Telehealth: Payer: Self-pay | Admitting: Pharmacist Clinician (PhC)/ Clinical Pharmacy Specialist

## 2022-08-29 ENCOUNTER — Other Ambulatory Visit (HOSPITAL_COMMUNITY): Payer: Self-pay

## 2022-08-29 NOTE — Telephone Encounter (Signed)
PER TEST CLAIM NO PA NEEDED FOR 30 DAY SUPPLY

## 2022-08-29 NOTE — Telephone Encounter (Signed)
Please do PA for Repatha 

## 2022-08-31 MED ORDER — REPATHA SURECLICK 140 MG/ML ~~LOC~~ SOAJ: 140.0000 mg | SUBCUTANEOUS | 12 refills | Status: DC

## 2022-08-31 NOTE — Addendum Note (Signed)
Addended by: Rosalee Kaufman on: 08/31/2022 04:53 PM   Modules accepted: Orders

## 2022-08-31 NOTE — Telephone Encounter (Signed)
Plan limit 34 day - patient will look into options of mail order.

## 2022-09-27 DIAGNOSIS — Z3042 Encounter for surveillance of injectable contraceptive: Secondary | ICD-10-CM | POA: Diagnosis not present

## 2022-10-18 DIAGNOSIS — Z1231 Encounter for screening mammogram for malignant neoplasm of breast: Secondary | ICD-10-CM | POA: Diagnosis not present

## 2022-11-27 ENCOUNTER — Encounter: Payer: Self-pay | Admitting: Pharmacist Clinician (PhC)/ Clinical Pharmacy Specialist

## 2022-11-27 DIAGNOSIS — E785 Hyperlipidemia, unspecified: Secondary | ICD-10-CM

## 2022-12-07 DIAGNOSIS — E785 Hyperlipidemia, unspecified: Secondary | ICD-10-CM | POA: Diagnosis not present

## 2022-12-08 LAB — LIPID PANEL
Chol/HDL Ratio: 1.5 ratio (ref 0.0–4.4)
Cholesterol, Total: 111 mg/dL (ref 100–199)
HDL: 72 mg/dL (ref >39)
LDL Chol Calc (NIH): 29 mg/dL (ref 0–99)
Triglycerides: 37 mg/dL (ref 0–149)
VLDL Cholesterol Cal: 10 mg/dL (ref 5–40)

## 2022-12-08 LAB — LIPOPROTEIN A (LPA): Lipoprotein (a): 211.5 nmol/L — ABNORMAL HIGH (ref <75.0)

## 2022-12-15 ENCOUNTER — Encounter: Payer: Self-pay | Admitting: Pharmacist Clinician (PhC)/ Clinical Pharmacy Specialist

## 2022-12-25 DIAGNOSIS — Z3042 Encounter for surveillance of injectable contraceptive: Secondary | ICD-10-CM | POA: Diagnosis not present

## 2023-03-15 ENCOUNTER — Other Ambulatory Visit (HOSPITAL_COMMUNITY): Payer: Self-pay

## 2023-03-15 ENCOUNTER — Telehealth: Payer: Self-pay | Admitting: Pharmacy Technician

## 2023-03-15 NOTE — Telephone Encounter (Signed)
Pharmacy Patient Advocate Encounter   Received notification from CoverMyMeds that prior authorization for repatha is required/requested.   Insurance verification completed.   The patient is insured through [employer] .   Per test claim: PA required; PA submitted to above mentioned insurance via CoverMyMeds Key/confirmation #/EOC [id number] Status is pending

## 2023-03-16 NOTE — Telephone Encounter (Signed)
Pharmacy Patient Advocate Encounter  Received notification from [facility] that Prior Authorization for repatha has been APPROVED from 03/16/23 to 03/14/24   PA #/Case ID/Reference #: [id number]

## 2023-03-23 DIAGNOSIS — Z3042 Encounter for surveillance of injectable contraceptive: Secondary | ICD-10-CM | POA: Diagnosis not present

## 2023-06-02 ENCOUNTER — Ambulatory Visit
Admission: RE | Admit: 2023-06-02 | Discharge: 2023-06-02 | Disposition: A | Discharge status: Home / Self Care | Payer: Self-pay | Source: Ambulatory Visit | Attending: Emergency Medicine

## 2023-06-02 VITALS — BP 127/84 | HR 77 | Temp 98.2°F | Resp 17 | Ht 65.0 in | Wt 165.0 lb

## 2023-06-02 DIAGNOSIS — J019 Acute sinusitis, unspecified: Secondary | ICD-10-CM | POA: Diagnosis not present

## 2023-06-02 DIAGNOSIS — B9689 Other specified bacterial agents as the cause of diseases classified elsewhere: Secondary | ICD-10-CM

## 2023-06-02 MED ORDER — AMOXICILLIN-POT CLAVULANATE 875-125 MG PO TABS: 1.0000 | ORAL_TABLET | Freq: Two times a day (BID) | ORAL | 0 refills | Status: DC

## 2023-06-02 MED ORDER — FLUCONAZOLE 150 MG PO TABS: ORAL_TABLET | ORAL | 0 refills | Status: DC

## 2023-06-02 NOTE — ED Provider Notes (Signed)
 [facility]    CSN: [id number] Arrival date & time: 06/02/23  1056      History   Chief Complaint Chief Complaint  Patient presents with   Nasal Congestion    Entered by patient    HPI Stephanie Mcfarland is a 48 y.o. female.   Patient presents to clinic over concern of facial pain, pressure, sneezing, nasal congestion and rhinorrhea that has been present for the past 6 days.  She has been trying multiple over-the-counter methods such as guaifenesin, Nasacort and TheraFlu.  Her symptoms have been gradually getting worse and today she presents presents to clinic with maxillary sinus pressure.  Has not had any fevers.  Denies cough.  Endorses postnasal drip.  Nasal congestion and rhinorrhea are yellow/green in color.  The history is provided by the patient and medical records.    Past Medical History:  Diagnosis Date   GERD (gastroesophageal reflux disease)    Nonobstructive atherosclerosis of coronary artery 10/22/2017   PAC (premature atrial contraction) 07/19/2017   Palpitations 07/19/2017   Pure hypercholesterolemia 03/30/2021   Seasonal allergies     Patient Active Problem List   Diagnosis Date Noted   Pure hypercholesterolemia 03/30/2021   Abnormal weight loss 08/24/2020   Abdominal bloating 08/24/2020   Epigastric pain 08/24/2020   Flatulence, eructation and gas pain 08/24/2020   Gastroesophageal reflux disease 08/24/2020   Left lower quadrant pain 08/24/2020   Vomiting without nausea 08/24/2020   Nonobstructive atherosclerosis of coronary artery 10/22/2017   Palpitations 07/19/2017   PAC (premature atrial contraction) 07/19/2017   Generalized anxiety disorder 03/03/2015   Insomnia 03/03/2015   Benign fasciculations 03/03/2015   FH: Huntington's chorea 03/03/2015    Past Surgical History:  Procedure Laterality Date   NO PAST SURGERIES      OB History   No obstetric history on file.      Home Medications    Prior to Admission medications    Medication Sig Start Date End Date Taking? Authorizing Provider  amoxicillin-clavulanate (AUGMENTIN) 875-125 MG tablet Take 1 tablet by mouth every 12 (twelve) hours. 06/02/23  Yes Rinaldo Ratel, [place] N, FNP  aspirin EC 81 MG tablet Take 1 tablet (81 mg total) by mouth daily. Swallow whole. 07/17/22  Yes Alver Sorrow, NP  cetirizine (ZYRTEC) 10 MG tablet Take 10 mg by mouth daily as needed for allergies.   Yes [provider]  Evolocumab (REPATHA SURECLICK) 140 MG/ML SOAJ Inject 140 mg into the skin every 14 (fourteen) days. 08/31/22  Yes Chilton Si, MD  fluconazole (DIFLUCAN) 150 MG tablet Take 1 tablet as needed if vaginal itching develops with antibiotic use. 06/02/23  Yes Rinaldo Ratel, [place] N, FNP  medroxyPROGESTERone Acetate 150 MG/ML SUSY Inject 1 mL into the muscle as directed. 05/07/20  Yes [provider]  Multiple Vitamins-Minerals (MULTIVITAMIN PO) Take 1 tablet by mouth daily.   Yes [provider]  rosuvastatin (CRESTOR) 20 MG tablet Take 1 tablet (20 mg total) by mouth daily. 07/17/22  Yes Alver Sorrow, NP  omeprazole (PRILOSEC) 40 MG capsule Take 40 mg by mouth as needed. 01/04/21   [provider]    Family History Family History  Problem Relation Age of Onset   Hyperlipidemia Mother    Diabetes Father    Hypertension Father    Heart attack Father    Stroke Father    Heart disease Father    Heart disease Brother    Hypertension Brother    Heart attack Brother  Heart disease Brother    Diabetes Paternal Grandfather    Stroke Brother    Thyroid disease Brother     Social History Social History   Tobacco Use   Smoking status: Never   Smokeless tobacco: Never  Vaping Use   Vaping status: Never Used  Substance Use Topics   Alcohol use: Yes    Alcohol/week: 0.0 standard drinks of alcohol    Comment: Socially   Drug use: No     Allergies   Sulfa antibiotics   Review of Systems Review of Systems  Per  HPI  Physical Exam Triage Vital Signs ED Triage Vitals  Encounter Vitals Group     BP 06/02/23 1108 127/84     Systolic BP Percentile --      Diastolic BP Percentile --      Pulse Rate 06/02/23 1108 77     Resp 06/02/23 1108 17     Temp 06/02/23 1108 98.2 F (36.8 C)     Temp Source 06/02/23 1108 Oral     SpO2 06/02/23 1108 100 %     Weight 06/02/23 1106 165 lb (74.8 kg)     Height 06/02/23 1106 5\' 5"  (1.651 m)     Head Circumference --      Peak Flow --      Pain Score 06/02/23 1106 0     Pain Loc --      Pain Education --      Exclude from Growth Chart --    No data found.  Updated Vital Signs BP 127/84 (BP Location: Right Arm)   Pulse 77   Temp 98.2 F (36.8 C) (Oral)   Resp 17   Ht 5\' 5"  (1.651 m)   Wt 165 lb (74.8 kg)   SpO2 100%   BMI 27.46 kg/m   Visual Acuity Right Eye Distance:   Left Eye Distance:   Bilateral Distance:    Right Eye Near:   Left Eye Near:    Bilateral Near:     Physical Exam Vitals and nursing note reviewed.  Constitutional:      Appearance: Normal appearance.  HENT:     Head: Normocephalic and atraumatic.     Right Ear: External ear normal.     Left Ear: External ear normal.     Nose: Congestion and rhinorrhea present.     Right Sinus: Maxillary sinus tenderness present.     Left Sinus: Maxillary sinus tenderness present.     Mouth/Throat:     Mouth: Mucous membranes are moist.     Pharynx: Posterior oropharyngeal erythema present.  Eyes:     Conjunctiva/sclera: Conjunctivae normal.  Cardiovascular:     Rate and Rhythm: Normal rate and regular rhythm.     Heart sounds: Normal heart sounds. No murmur heard. Pulmonary:     Effort: Pulmonary effort is normal. No respiratory distress.     Breath sounds: Normal breath sounds. No wheezing.  Musculoskeletal:        General: Normal range of motion.  Skin:    General: Skin is warm and dry.  Neurological:     General: No focal deficit present.     Mental Status: She is alert.   Psychiatric:        Mood and Affect: Mood normal.        Behavior: Behavior is cooperative.      UC Treatments / Results  Labs (all labs ordered are listed, but only abnormal results are displayed) Labs Reviewed -  No data to display  EKG   Radiology No results found.  Procedures Procedures (including critical care time)  Medications Ordered in UC Medications - No data to display  Initial Impression / Assessment and Plan / UC Course  I have reviewed the triage vital signs and the nursing notes.  Pertinent labs & imaging results that were available during my care of the patient were reviewed by me and considered in my medical decision making (see chart for details).  Vitals and triage reviewed, patient is hemodynamically stable.  Congestion, rhinorrhea and postnasal drip present on physical exam.  Lungs are vesicular, heart with regular rate and rhythm.  Maxillary sinus tenderness and presentation consistent with acute bacterial sinusitis, will treat with Augmentin.  Symptomatic management for congestion discussed.  Plan of care, follow-up care return precautions given, no questions at this time.     Final Clinical Impressions(s) / UC Diagnoses   Final diagnoses:  Acute bacterial sinusitis     Discharge Instructions      Take the antibiotics twice daily with food until finished.  You can take 1200 mg of Mucinex daily to help loosen secretions.  Doing saline rinses with a Nettie pot and filtered water and sleeping with a humidifier may help as well.  You can use the Diflucan as needed if vaginal itching develops with antibiotic use.  Increasing your probiotics can help prevent vaginal yeast infection as well.  Symptoms should improve with the antibiotics, if no improvement or any changes follow-up with your primary care provider or return to clinic for reevaluation.    ED Prescriptions     Medication Sig Dispense Auth. Provider   amoxicillin-clavulanate (AUGMENTIN)  875-125 MG tablet Take 1 tablet by mouth every 12 (twelve) hours. 14 tablet Rinaldo Ratel, [place] N, [place]   fluconazole (DIFLUCAN) 150 MG tablet Take 1 tablet as needed if vaginal itching develops with antibiotic use. 1 tablet Shivansh Hardaway, [place] N, FNP      PDMP not reviewed this encounter.   Laira Penninger, [place] N, [place] 06/02/23 1144

## 2023-06-02 NOTE — ED Triage Notes (Signed)
 Pt states that she has some facial pain, facial pressure, sneezing, and nasal congestion. X5 days

## 2023-06-02 NOTE — Discharge Instructions (Addendum)
 Take the antibiotics twice daily with food until finished.  You can take 1200 mg of Mucinex daily to help loosen secretions.  Doing saline rinses with a Nettie pot and filtered water and sleeping with a humidifier may help as well.  You can use the Diflucan as needed if vaginal itching develops with antibiotic use.  Increasing your probiotics can help prevent vaginal yeast infection as well.  Symptoms should improve with the antibiotics, if no improvement or any changes follow-up with your primary care provider or return to clinic for reevaluation.

## 2023-06-13 DIAGNOSIS — Z3042 Encounter for surveillance of injectable contraceptive: Secondary | ICD-10-CM | POA: Diagnosis not present

## 2023-07-21 ENCOUNTER — Other Ambulatory Visit (HOSPITAL_BASED_OUTPATIENT_CLINIC_OR_DEPARTMENT_OTHER): Payer: Self-pay | Admitting: Family

## 2023-07-21 DIAGNOSIS — I2510 Atherosclerotic heart disease of native coronary artery without angina pectoris: Secondary | ICD-10-CM

## 2023-09-03 DIAGNOSIS — Z3042 Encounter for surveillance of injectable contraceptive: Secondary | ICD-10-CM | POA: Diagnosis not present

## 2023-10-16 ENCOUNTER — Ambulatory Visit (HOSPITAL_BASED_OUTPATIENT_CLINIC_OR_DEPARTMENT_OTHER): Admitting: Family

## 2023-10-30 ENCOUNTER — Other Ambulatory Visit (HOSPITAL_BASED_OUTPATIENT_CLINIC_OR_DEPARTMENT_OTHER): Payer: Self-pay | Admitting: Cardiovascular Disease

## 2023-10-30 DIAGNOSIS — I2510 Atherosclerotic heart disease of native coronary artery without angina pectoris: Secondary | ICD-10-CM

## 2024-01-01 ENCOUNTER — Ambulatory Visit (HOSPITAL_BASED_OUTPATIENT_CLINIC_OR_DEPARTMENT_OTHER): Payer: Self-pay | Admitting: Family

## 2024-03-05 ENCOUNTER — Ambulatory Visit: Admitting: Podiatry

## 2024-03-19 ENCOUNTER — Ambulatory Visit: Admitting: Podiatry

## 2024-03-25 ENCOUNTER — Other Ambulatory Visit (HOSPITAL_COMMUNITY): Payer: Self-pay

## 2024-03-25 ENCOUNTER — Ambulatory Visit (HOSPITAL_BASED_OUTPATIENT_CLINIC_OR_DEPARTMENT_OTHER): Payer: Self-pay | Admitting: Family

## 2024-03-25 ENCOUNTER — Encounter (HOSPITAL_BASED_OUTPATIENT_CLINIC_OR_DEPARTMENT_OTHER): Payer: Self-pay | Admitting: Family

## 2024-03-25 ENCOUNTER — Telehealth: Payer: Self-pay | Admitting: Pharmacy Technician

## 2024-03-25 VITALS — BP 112/82 | HR 81 | Ht 65.0 in | Wt 174.2 lb

## 2024-03-25 DIAGNOSIS — I2510 Atherosclerotic heart disease of native coronary artery without angina pectoris: Secondary | ICD-10-CM | POA: Diagnosis not present

## 2024-03-25 DIAGNOSIS — I493 Ventricular premature depolarization: Secondary | ICD-10-CM

## 2024-03-25 DIAGNOSIS — R55 Syncope and collapse: Secondary | ICD-10-CM | POA: Diagnosis not present

## 2024-03-25 DIAGNOSIS — R739 Hyperglycemia, unspecified: Secondary | ICD-10-CM | POA: Diagnosis not present

## 2024-03-25 DIAGNOSIS — E7849 Other hyperlipidemia: Secondary | ICD-10-CM | POA: Diagnosis not present

## 2024-03-25 DIAGNOSIS — Z021 Encounter for pre-employment examination: Secondary | ICD-10-CM | POA: Diagnosis not present

## 2024-03-25 MED ORDER — REPATHA SURECLICK 140 MG/ML ~~LOC~~ SOAJ: 140.0000 mg | SUBCUTANEOUS | 5 refills | Status: AC

## 2024-03-25 MED ORDER — ROSUVASTATIN CALCIUM 20 MG PO TABS: 20.0000 mg | ORAL_TABLET | Freq: Every day | ORAL | 3 refills | Status: AC

## 2024-03-25 NOTE — Telephone Encounter (Signed)
 Pharmacy Patient Advocate Encounter  Insurance verification completed.   The patient is insured through [employer]   Ran test claim for Repatha . Currently a quantity of 2 ml is a 28 day supply and the co-pay is 34.99 .  This test claim was processed through [facility]- copay amounts may vary at other pharmacies due to pharmacy/plan contracts, or as the patient moves through the different stages of their insurance plan.

## 2024-03-25 NOTE — Patient Instructions (Signed)
 Medication Instructions:  START Repatha  140mg  every 14 days  *If you need a refill on your cardiac medications before your next appointment, please call your pharmacy*  Lab Work: Your physician recommends that you return for lab work today: Quantiferon gold, A1c, lipid panel  If you have labs (blood work) drawn today and your tests are completely normal, you will receive your results only by: MyChart Message (if you have MyChart) OR A paper copy in the mail If you have any lab test that is abnormal or we need to change your treatment, we will call you to review the results.  Testing/Procedures: Your physician has requested that you have an echocardiogram. Echocardiography is a painless test that uses sound waves to create images of your heart. It provides your doctor with information about the size and shape of your heart and how well your hearts chambers and valves are working. This procedure takes approximately one hour. There are no restrictions for this procedure. Please do NOT wear cologne, perfume, aftershave, or lotions (deodorant is allowed). Please arrive 15 minutes prior to your appointment time.  Please note: We ask at that you not bring children with you during ultrasound (echo/ vascular) testing. Due to room size and safety concerns, children are not allowed in the ultrasound rooms during exams. Our front office staff cannot provide observation of children in our lobby area while testing is being conducted. An adult accompanying a patient to their appointment will only be allowed in the ultrasound room at the discretion of the ultrasound technician under special circumstances. We apologize for any inconvenience.   Follow-Up: At [facility], you and your health needs are our priority.  As part of our continuing mission to provide you with exceptional heart care, our providers are all part of one team.  This team includes your primary Cardiologist (physician) and Advanced  Practice Providers or APPs (Physician Assistants and Nurse Practitioners) who all work together to provide you with the care you need, when you need it.  Your next appointment:   3 month(s)  Provider:   Annabella Scarce, MD, Rosaline Bane, NP, or Reche Finder, NP    We recommend signing up for the patient portal called MyChart.  Sign up information is provided on this After Visit Summary.  MyChart is used to connect with patients for Virtual Visits (Telemedicine).  Patients are able to view lab/test results, encounter notes, upcoming appointments, etc.  Non-urgent messages can be sent to your provider as well.   To learn more about what you can do with MyChart, go to [email or web address].   Other Instructions  Heart Healthy Diet Recommendations: A low-salt diet is recommended. Meats should be grilled, baked, or boiled. Avoid fried foods. Focus on lean protein sources like fish or chicken with vegetables and fruits. The American Heart Association is a Chief Technology Officer!  American Heart Association Diet and Lifeystyle Recommendations   Exercise recommendations: The American Heart Association recommends 150 minutes of moderate intensity exercise weekly. Try 30 minutes of moderate intensity exercise 4-5 times per week. This could include walking, jogging, or swimming.

## 2024-03-25 NOTE — Progress Notes (Signed)
 " Cardiology Office Note   Date:  03/25/2024  ID:  Stephanie Mcfarland, DOB 28-Dec-1975, MRN [id number] PCP: Patient, No Pcp Per   HeartCare Providers Cardiologist:  Annabella Scarce, MD     History of Present Illness Stephanie Mcfarland is a 49 y.o. female  with a hx of nonobstructive coronary disease, PVC, allergies, GERD.  Family history notable for premature coronary disease, stroke.   Established 07/2017 for palpitations at the request of her OB/GYN.  Prior evaluation 2015 with Holter monitor revealing occasional PVC and otherwise unremarkable.  She was started on metoprolol  with improvement in symptoms.  Reported atypical chest pain.  Subsequent coronary CTA/2019 mild proximal LAD, proximal LCx, mid RCA disease.  Coronary calcium  score 469 placing her in the 99th percentile.  She was started on aspirin  and rosuvastatin .   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 EKG, CMP, CBC unremarkable.   Presents today for follow up. Occasional episodes of lightheadedness. Not associated with palpitations, vision changes. She has rare palpitations which self resolve. Inquires about blood clot, discussed symptoms atypical for DVT or PE (no LE edema, dyspnea). Notes eating well, staying hydrated, not regularly skipping meals. Exercising regularly at the gym. Running in the warmer months. Eats predominantly at home and adheres to low sodium diet.   ROS: Please see the history of present illness.    All other systems reviewed and are negative.   Studies Reviewed EKG Interpretation Date/Time:  Tuesday March 25 2024 08:07:36 EST Ventricular Rate:  81 PR Interval:  176 QRS Duration:  64 QT Interval:  358 QTC Calculation: 415 R Axis:   23  Text Interpretation: Normal sinus rhythm Low voltage QRS  No acute ST/T wave changes Confirmed by Vannie Mora ([id number]) on 03/25/2024 8:11:50 AM    Cardiac Studies &  Procedures   ______________________________________________________________________________________________          CT SCANS  CT CORONARY MORPH W/CTA COR W/SCORE 10/09/2017  Addendum 10/09/2017  5:50 PM ADDENDUM REPORT: 10/09/2017 17:48  CLINICAL DATA:  Chest pain  EXAM: Cardiac CTA  MEDICATIONS: Sub lingual nitro. 4mg  and lopressor  mg  TECHNIQUE: The patient was scanned on a Siemens 192 slice scanner. Gantry rotation speed was 250 msecs. Collimation was 0.8 mm. A 100 kV prospective scan was triggered in the ascending thoracic aorta at 35-75% of the R-R interval. Average HR during the scan was 60 bpm. The 3D data set was interpreted on a dedicated work station using MPR, MIP and VRT modes. A total of 80cc of contrast was used.  FINDINGS: Non-cardiac: See separate report from [facility].  Calcium  Score: 469 Agatston units.  Coronary Arteries: Right dominant with no anomalies  LM: No plaque or stenosis.  LAD system: Calcified plaque with mild stenosis in the proximal LAD. Misregistration artifact in the mid LAD but this does not affect the ability to read the study.  Circumflex system: Relatively small vessel. Calcified plaque in the proximal LCx with mild stenosis.  RCA system: Mixed plaque in the proximal and mid RCA with mild stenosis. Misregistration artifact in the mid RCA but this does not affect ability to read study.  IMPRESSION: 1. Extensive plaque for age and gender, but there appears to be no more than mild stenosis.  2. Coronary artery calcium  score 469 Agatston units, placing the patient in the 99th percentile for age and gender. This suggests high risk for future cardiac  events.  [name]   Electronically Signed By: Ezra Shuck M.D. On: 10/09/2017 17:48  Narrative EXAM: OVER-READ INTERPRETATION  CT CHEST  The following report is an over-read performed by radiologist Dr. Franky Crease of [facility] on  10/09/2017. This over-read does not include interpretation of cardiac or coronary anatomy or pathology. The coronary CTA interpretation by the cardiologist is attached.  COMPARISON:  08/21/2017  FINDINGS: Vascular: Visualized aorta is normal caliber. Heart is upper normal size.  Mediastinum/Nodes: No adenopathy in the lower mediastinum or hila.  Lungs/Pleura: Visualized lungs clear.  No effusions.  Upper Abdomen: Imaging into the upper abdomen shows no acute findings.  Musculoskeletal: Chest wall soft tissues are unremarkable. No acute bony abnormality.  IMPRESSION: No acute or significant extracardiac abnormality.  Electronically Signed: By: Franky Crease M.D. On: 10/09/2017 10:12   CT SCANS  CT CARDIAC SCORING (SELF PAY ONLY) 08/21/2017  Addendum 08/21/2017 11:53 AM ADDENDUM REPORT: 08/21/2017 11:51  CLINICAL DATA:  Risk stratification  EXAM: Coronary Calcium  Score  TECHNIQUE: The patient was scanned on a [employer]. Axial non-contrast 3 mm slices were carried out through the heart. The data set was analyzed on a dedicated work station and scored using the Agatson method.  FINDINGS: Non-cardiac: See separate report from [facility].  Ascending Aorta: Normal size, no calcifications.  Pericardium: Normal.  Coronary arteries: Normal origin.  IMPRESSION: Coronary calcium  score of 516 (in all 3 coronary arteries). This was 40 percentile for age and sex matched control. Consider a coronary CTA for further evaluation given very high calcium  score.   Electronically Signed By: Leim Moose On: 08/21/2017 11:51  Narrative EXAM: OVER-READ INTERPRETATION  CT CHEST  The following report is an over-read performed by radiologist Dr. Franky Crease of [facility] on 08/21/2017. This over-read does not include interpretation of cardiac or coronary anatomy or pathology. The coronary calcium  score interpretation by the cardiologist is  attached.  COMPARISON:  CT 03/20/2015  FINDINGS: Vascular: Heart is normal size.  Visualized aorta is normal caliber.  Mediastinum/Nodes: No adenopathy in the lower mediastinum or hila.  Lungs/Pleura: Visualized lungs clear.  No effusions.  Upper Abdomen: Imaging into the upper abdomen shows no acute findings.  Musculoskeletal: No acute bony abnormality. Chest wall soft tissues are unremarkable.  IMPRESSION: No acute or significant extracardiac abnormality.  Electronically Signed: By: Franky Crease M.D. On: 08/21/2017 08:50     ______________________________________________________________________________________________      Risk Assessment/Calculations           Physical Exam VS:  BP 112/82 (BP Location: Left Arm, Patient Position: Sitting, Cuff Size: Large)   Pulse 81   Ht 5' 5 (1.651 m)   Wt 174 lb 3.2 oz (79 kg)   SpO2 97%   BMI 28.99 kg/m        Wt Readings from Last 3 Encounters:  03/25/24 174 lb 3.2 oz (79 kg)  06/02/23 165 lb (74.8 kg)  07/17/22 162 lb 8 oz (73.7 kg)    GEN: Well nourished, well developed in no acute distress NECK: No JVD; No carotid bruits CARDIAC: RRR, no murmurs, rubs, gallops RESPIRATORY:  Clear to auscultation without rales, wheezing or rhonchi  ABDOMEN: Soft, non-tender, non-distended EXTREMITIES:  No edema; No deformity   ASSESSMENT AND PLAN  Nonobstructive coronary artery disease-coronary CTA 10/09/2017 with coronary calcium  score 469 placing her 90th percentile with overall no more than mild stenosis.  Stable with no anginal symptoms. No indication for ischemic evaluation.  GDMT aspirin  81 mg daily, rosuvastatin  20 mg daily.  Refills provided.  Resume Repatha  140 mg q. 14 days, as below. Recommend aiming for 150 minutes of moderate intensity activity per week and following a heart healthy diet.     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 Continue Crestor  20QD.  Refill provided.  Will resume Repatha   140 mg every 14 days (had interruption in dosing due to insurance changes).  Will plan to repeat lipid panel at her follow-up visit in 3 months.  Near-syncope-episodes of lightheadedness described as near syncope.  No true syncope.  Not associate with palpitations, dyspnea, amaurosis fugax.  Suspect vasovagal in etiology.  Increase hydration, regular meals encouraged.  Plan for echocardiogram to rule out valvular abnormality.   PVC-reports intermittent palpitations lasting seconds.  Discussed reducing caffeine/eoh intake, managing stress well, staying hydrated. Previously did not feel Metoprolol  helped much with symptoms. As symptoms infrequent, defer ZIO. If increased frequency in the future, could reconsider.            Dispo: follow up in 3 mos. consider lipid panel at that time.  Signed, Reche GORMAN Finder, NP   "

## 2024-03-27 ENCOUNTER — Ambulatory Visit (HOSPITAL_BASED_OUTPATIENT_CLINIC_OR_DEPARTMENT_OTHER): Payer: Self-pay | Admitting: Family

## 2024-03-27 ENCOUNTER — Encounter (HOSPITAL_BASED_OUTPATIENT_CLINIC_OR_DEPARTMENT_OTHER): Payer: Self-pay | Admitting: *Deleted

## 2024-03-27 LAB — QUANTIFERON-TB GOLD PLUS
QuantiFERON Mitogen Value: 5.31 [IU]/mL
QuantiFERON Nil Value: 0.01 [IU]/mL
QuantiFERON TB1 Ag Value: 0.02 [IU]/mL
QuantiFERON TB2 Ag Value: 0.03 [IU]/mL
QuantiFERON-TB Gold Plus: NEGATIVE

## 2024-03-27 LAB — LIPID PANEL
Chol/HDL Ratio: 2.4 ratio (ref 0.0–4.4)
Cholesterol, Total: 160 mg/dL (ref 100–199)
HDL: 67 mg/dL
LDL Chol Calc (NIH): 81 mg/dL (ref 0–99)
Triglycerides: 59 mg/dL (ref 0–149)
VLDL Cholesterol Cal: 12 mg/dL (ref 5–40)

## 2024-03-27 LAB — HEMOGLOBIN A1C
Est. average glucose Bld gHb Est-mCnc: 117 mg/dL
Hgb A1c MFr Bld: 5.7 % — ABNORMAL HIGH (ref 4.8–5.6)

## 2024-04-28 ENCOUNTER — Other Ambulatory Visit (HOSPITAL_BASED_OUTPATIENT_CLINIC_OR_DEPARTMENT_OTHER)

## 2024-07-04 ENCOUNTER — Ambulatory Visit (HOSPITAL_BASED_OUTPATIENT_CLINIC_OR_DEPARTMENT_OTHER): Admitting: Family
